# Patient Record
Sex: Male | Born: 1970 | Race: White | Hispanic: No | Marital: Married | Smoking: Never smoker
Health system: Southern US, Community
[De-identification: ages and names within clinical notes are randomized; demographics above are authoritative.]

## PROBLEM LIST (undated history)

## (undated) HISTORY — PX: TONSILLECTOMY AND ADENOIDECTOMY: SHX28

---

## 2007-05-29 ENCOUNTER — Emergency Department (HOSPITAL_COMMUNITY): Admission: EM | Admit: 2007-05-29 | Discharge: 2007-05-29 | Payer: Self-pay | Admitting: Family Medicine

## 2021-10-23 NOTE — Progress Notes (Signed)
? ? ?Subjective:   ? ?CC: L thumb pain ? ?I, Christoper Fabian, LAT, ATC, am serving as scribe for Dr. Clementeen Graham. ? ?HPI: Shawn Parker is a 51 y/o male c/o L thumb pain x 1 month. MOI: Shawn Parker was skiing w/ polls in his hand, forcing his L thumb into extension/ABD. He locates his pain to L 1st MCP joint, all over the joint. ? ?L thumb swelling: yes ?Pinch strength: decreases ?Aggravating factors: trying to grip things, writing ?Treatments tried: ice, Tylenol ? ?Shawn Parker also c/o pain in his R 4th finger ongoing for about 3 weeks. He caught his finger in a metal loop while rock climbing. Shawn Parker locates pain to the DIP of the R 4th finger. ? ?Pertinent review of Systems: no fever or chills ? ?Relevant historical information: Healthy activ. Does rock climbing.  ? ? ?Objective:   ? ?Vitals:  ? 10/24/21 1133  ?BP: 118/80  ?Pulse: (!) 56  ?SpO2: 99%  ? ?General: Well Developed, well nourished, and in no acute distress.  ? ?MSK: Left thumb swelling at ulnar MCP.  ?Laxity to UCL stress significant compared to right.  ?Intact strength to thumb flexion and extension at MCP.  ?Pulses and cap refill intact.  ? ?Right 4th DIP swelling and TTP at ulnar side of joint.  ?Decreased ROM at DIP.  ?Intact strength to flexion and extension.  ? ? ?Lab and Radiology Results ? ?Diagnostic Limited MSK Ultrasound of: Left thumb ?Hypoechoic fluid collection ulnar side of the near MCP. ?Unfortunately I cannot tell for sure if the UCL is torn due to resolution limitations of the ultrasound unit. ?However there is more laxity than I would expect at the ulnar side of the joint with UCL stress test. ?Impression: Possible UCL tear although not definitive on this ultrasound ? ? ?Xray images of right 4th digit and left 1st digit obtained today personally and independently interpreted. ? ?Right fourth DIP: Avulsion fracture 4th DIP at ulnar side. Fracture extends into the joint.  ? ?Left 1st MCP: No acute fracture present.  ? ?Await formal radiology review ? ? ?Impression  and Recommendations:   ? ?Assessment and Plan: ?51 y.o. male with Left thumb injury: Thought to be UCL injury/gamekeeper thumb. He is active and require the active use of his hand with rock climbing. I am concerned that his injury may not do well with conservative management. Plan to refer to hand surgery to explore surgical options. In the interim plan for short thumb spica splint.  ? ?Right 4th DIP Unexpected fracture seen on xray as Shawn Parker left the clinic.  Fortunately he was treated with a STAX splint which is appropriate for this injury.  Plan to recheck in 2 weeks.  ? ?PDMP not reviewed this encounter. ?Orders Placed This Encounter  ?Procedures  ? DG Finger Thumb Left  ?  Standing Status:   Future  ?  Number of Occurrences:   1  ?  Standing Expiration Date:   10/25/2022  ?  Order Specific Question:   Reason for Exam (SYMPTOM  OR DIAGNOSIS REQUIRED)  ?  Answer:   left thumb pain  ?  Order Specific Question:   Preferred imaging location?  ?  Answer:   [facility]  ? [place] LIMITED JOINT SPACE STRUCTURES UP LEFT(NO LINKED CHARGES)  ?  Order Specific Question:   Reason for Exam (SYMPTOM  OR DIAGNOSIS REQUIRED)  ?  Answer:   left thumb pain  ?  Order Specific Question:   Preferred imaging  location?  ?  Answer:   [facility]  ? DG Finger Ring Right  ?  Standing Status:   Future  ?  Number of Occurrences:   1  ?  Standing Expiration Date:   10/25/2022  ?  Order Specific Question:   Reason for Exam (SYMPTOM  OR DIAGNOSIS REQUIRED)  ?  Answer:   right ring finger pain  ?  Order Specific Question:   Preferred imaging location?  ?  Answer:   [facility]  ? Ambulatory referral to Orthopedic Surgery  ?  Referral Priority:   Routine  ?  Referral Type:   Surgical  ?  Referral Reason:   Specialty Services Required  ?  Referred to Provider:   Marlyne Beards, MD  ?  Requested Specialty:   Orthopedic Surgery  ?  Number of Visits Requested:   1  ? ?No orders of the defined types were  placed in this encounter. ? ? ?Discussed warning signs or symptoms. Please see discharge instructions. Patient expresses understanding. ? ? ?The above documentation has been reviewed and is accurate and complete Clementeen Graham, M.D. ? ?

## 2021-10-24 ENCOUNTER — Ambulatory Visit (INDEPENDENT_AMBULATORY_CARE_PROVIDER_SITE_OTHER): Payer: BLUE CROSS/BLUE SHIELD

## 2021-10-24 ENCOUNTER — Ambulatory Visit (INDEPENDENT_AMBULATORY_CARE_PROVIDER_SITE_OTHER): Payer: BLUE CROSS/BLUE SHIELD | Admitting: Family Medicine

## 2021-10-24 ENCOUNTER — Ambulatory Visit: Payer: Self-pay

## 2021-10-24 VITALS — BP 118/80 | HR 56 | Ht 70.0 in | Wt 166.0 lb

## 2021-10-24 DIAGNOSIS — S6991XA Unspecified injury of right wrist, hand and finger(s), initial encounter: Secondary | ICD-10-CM | POA: Diagnosis not present

## 2021-10-24 DIAGNOSIS — G8929 Other chronic pain: Secondary | ICD-10-CM | POA: Diagnosis not present

## 2021-10-24 DIAGNOSIS — M79645 Pain in left finger(s): Secondary | ICD-10-CM

## 2021-10-24 DIAGNOSIS — S63642A Sprain of metacarpophalangeal joint of left thumb, initial encounter: Secondary | ICD-10-CM

## 2021-10-24 DIAGNOSIS — S62664A Nondisplaced fracture of distal phalanx of right ring finger, initial encounter for closed fracture: Secondary | ICD-10-CM

## 2021-10-24 IMAGING — DX DG FINGER THUMB 2+V*L*
3 series · 3 of 3 positions shown · non-contrast
Comparison: None.

CLINICAL DATA: Left thumb pain.  Skiing injury 1 month ago.

EXAM:
LEFT THUMB 2+V

[finger ap]
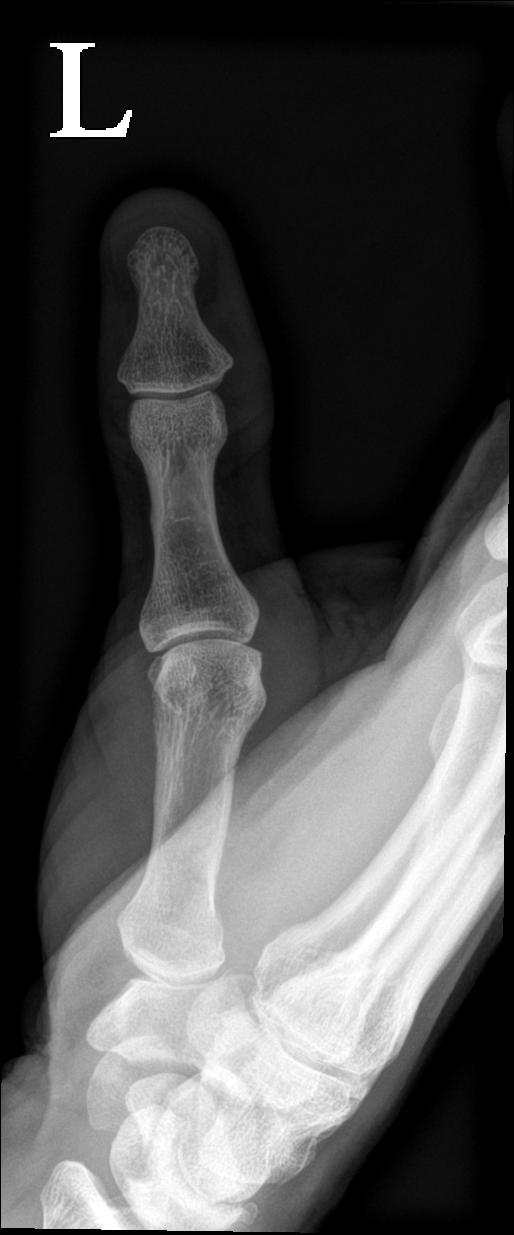

[finger obl]
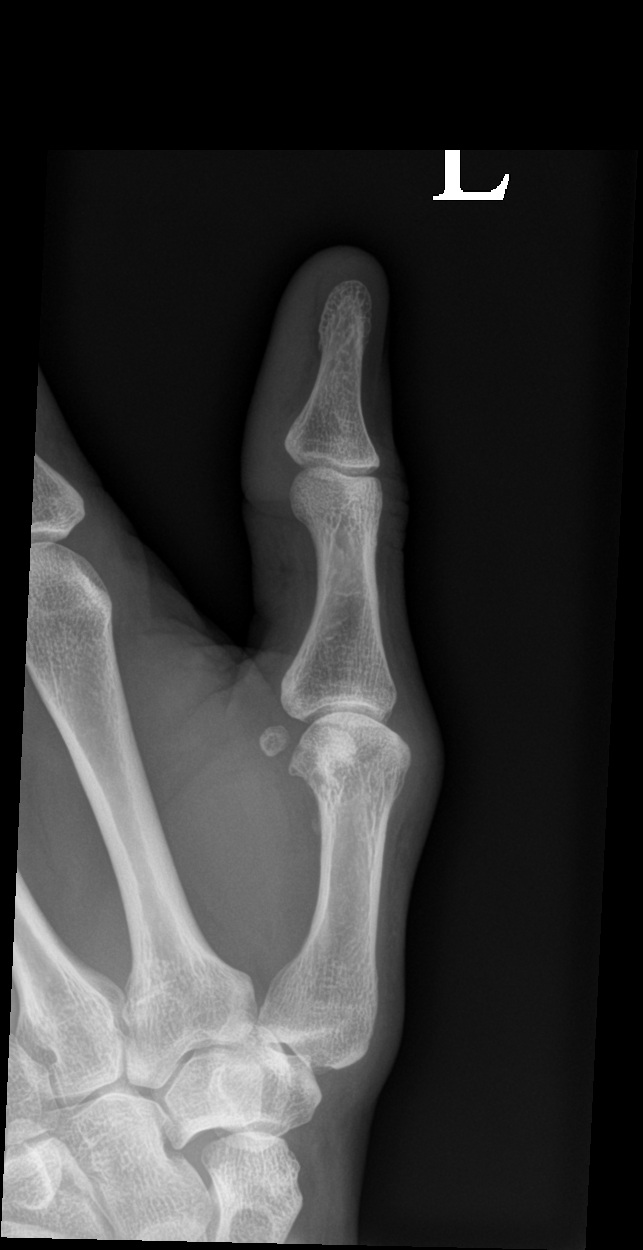

[finger lat]
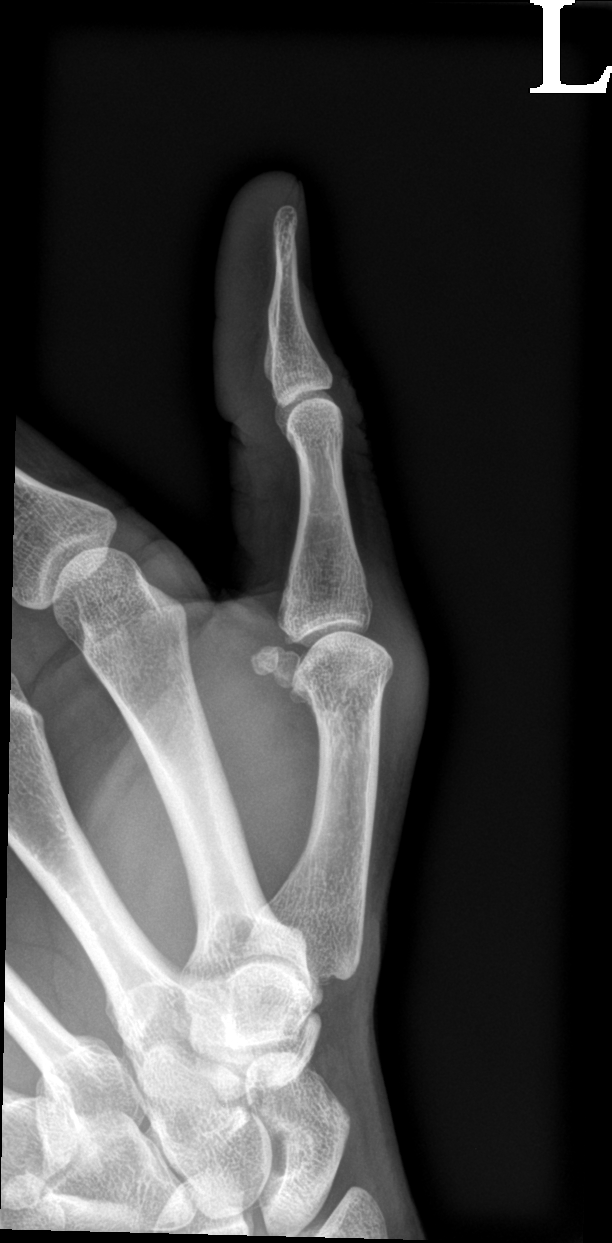

[3 of 3 positions shown; findings below may reference images not displayed]

FINDINGS: Minimal lateral aspect of the thumb metacarpophalangeal joint space
narrowing. Minimal thumb carpometacarpal joint space narrowing.
Minimal volar thumb metacarpal head degenerative osteophytosis. No
acute fracture is seen. No dislocation.
IMPRESSION: Mild osteoarthritis.  No acute fracture.

## 2021-10-24 IMAGING — DX DG FINGER RING 2+V*R*
3 series · 3 of 3 positions shown · non-contrast
Comparison: None

CLINICAL DATA: Right ring finger pain after injury rock climbing
2-3 weeks prior.

EXAM:
RIGHT RING FINGER 2+V

[finger ap]
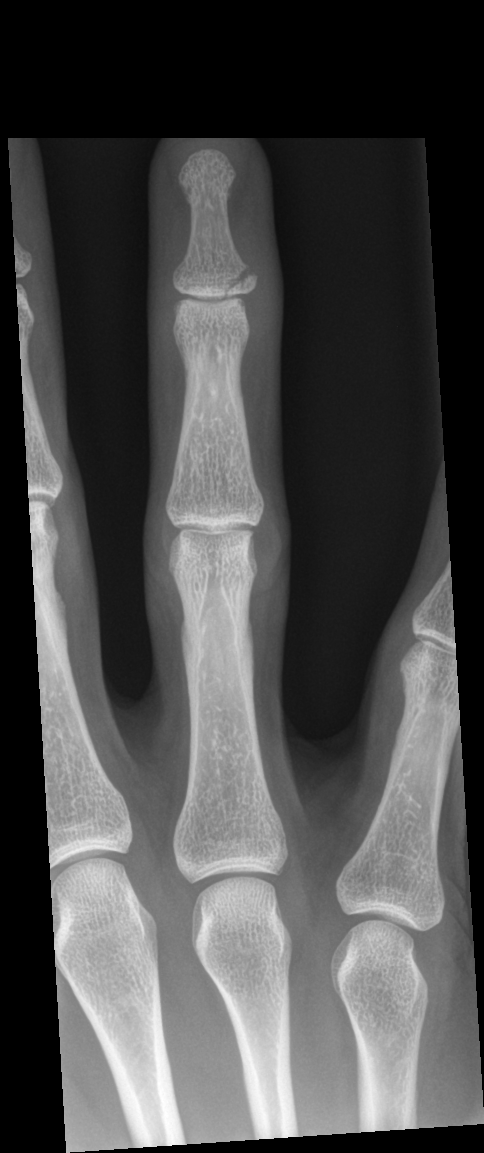

[finger obl]
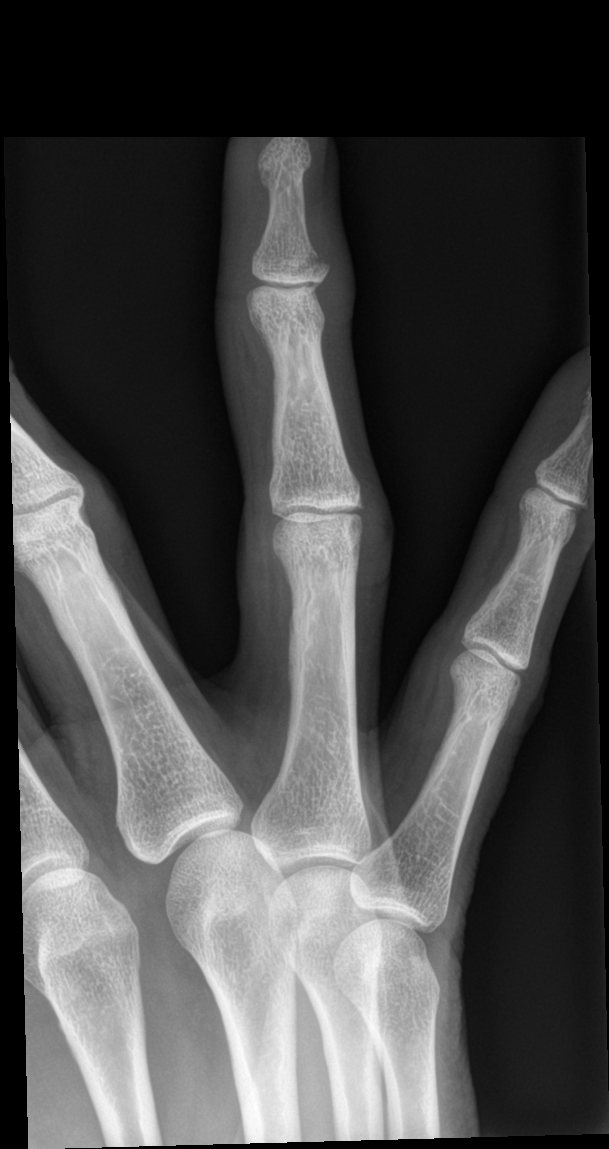

[finger lat]
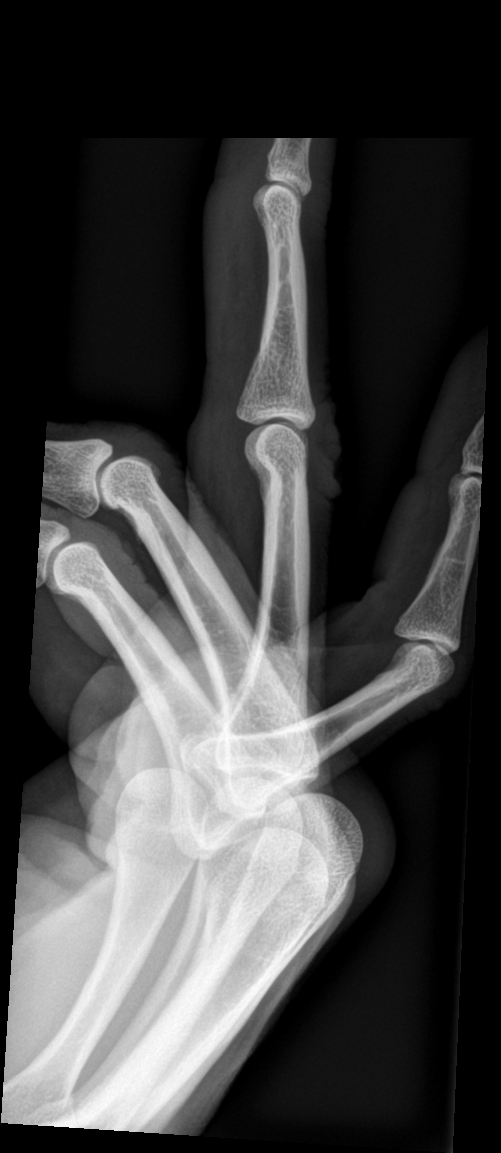

[3 of 3 positions shown; findings below may reference images not displayed]

FINDINGS: Oblique linear lucency within the medial base of the distal phalanx
of the ring finger with extension of the proximal articular surface,
with only 1 mm diastasis at the distal medial cortex but otherwise
no displacement. Joint spaces are preserved. No dislocation.
IMPRESSION: Acute, nondisplaced, intra-articular fracture of the medial base of
the distal phalanx of fourth finger.

## 2021-10-24 NOTE — Patient Instructions (Addendum)
Thank you for coming in today.  ? ?I've referred you to Orthopedic Surgery.  Let us know if you don't hear from them in one week.  ? ?Please go to [facility] supply to get the short thumb spica splint we talked about today. You may also be able to get it from [employer].   ? ?Recheck back as needed ? ?

## 2021-10-26 NOTE — Progress Notes (Signed)
Right ring finger x-ray does show a small fracture at the very end of the finger like we talked about.

## 2021-10-26 NOTE — Progress Notes (Signed)
Thumb x-ray shows a little bit of arthritis at the base of the thumb but otherwise looks okay to radiology.

## 2021-10-30 ENCOUNTER — Ambulatory Visit (INDEPENDENT_AMBULATORY_CARE_PROVIDER_SITE_OTHER): Payer: BLUE CROSS/BLUE SHIELD | Admitting: Orthopedic Surgery

## 2021-10-30 ENCOUNTER — Encounter: Payer: Self-pay | Admitting: Orthopedic Surgery

## 2021-10-30 VITALS — Ht 70.0 in | Wt 169.2 lb

## 2021-10-30 DIAGNOSIS — S63642A Sprain of metacarpophalangeal joint of left thumb, initial encounter: Secondary | ICD-10-CM

## 2021-10-30 DIAGNOSIS — S62634A Displaced fracture of distal phalanx of right ring finger, initial encounter for closed fracture: Secondary | ICD-10-CM | POA: Diagnosis not present

## 2021-10-30 NOTE — Progress Notes (Signed)
? ?Office Visit Note ?  ?Patient: Shawn Parker           ?Date of Birth: Sep 04, 1970           ?MRN: [id number] ?Visit Date: 10/30/2021 ?             ?Requested by: Gregor Hams, MD ?[place],  [place] [id number] ?PCP: No primary care provider on file. ? ? ?Assessment & Plan: ?Visit Diagnoses:  ?1. Rupture of ulnar collateral ligament of left thumb, initial encounter   ?2. Closed displaced fracture of distal phalanx of right ring finger, initial encounter   ? ? ?Plan: We reviewed the nature of thumb collateral ligament injuries as well as their diagnosis, prognosis, and both conservative and surgical treatment options.  He does have slightly increased laxity compared to the contralateral side at 30 degrees of flexion but has a firm endpoint.  He has pain with palpation of the metacarpal but no pain with ulnar stress.  I would like to get an MRI to further evaluate his ulnar collateral ligament.  I will see him back in the office once the MRI is completed. ? ?He also has a fracture of the right ring finger distal phalanx at the ulnar base consistent with a collateral ligament avulsion type fracture.  He is in a extension splint currently.  He has no instability at this joint with minimal tenderness to palpation.  Discussed the nature of fracture healing.  We will keep him in the splint for several more weeks. ? ?Follow-Up Instructions: No follow-ups on file.  ? ?Orders:  ?Orders Placed This Encounter  ?Procedures  ? MR HAND LEFT WO CONTRAST  ? ?No orders of the defined types were placed in this encounter. ? ? ? ? Procedures: ?No procedures performed ? ? ?Clinical Data: ?No additional findings. ? ? ?Subjective: ?Chief Complaint  ?Patient presents with  ? Left Hand - New Patient (Initial Visit)  ? Right Hand - New Patient (Initial Visit)  ? ? ?This is a 51 year old left-hand-dominant male who presents with 2 injuries to his left and right hands.  He was skiing about a month ago when he fell and got his hand  caught in some deep snow.  The pole forced his left thumb into extension and abduction.  Since then he has developed swelling and pain across the thumb MP joint.  His pain seems to be mostly at the ulnar aspect of the joint.  He has been wearing a thumb spica brace on the left hand since he was seen in the sports medicine office on 4/26.  He notes that the joint has stiffened somewhat since that visit with mildly increased pain and swelling.  He denies any previous injuries to this thumb.  On the right side, he was found to have a fracture of the ulnar base of the right ring finger distal phalanx.  He got this finger caught in a climbing ring approximately 3 weeks ago.  He has some pain in this joint with tight gripping with this finger as you would see with gripping a climbing hold.  He has been in a stack splint since the visit with the sports medicine office. ? ? ?Review of Systems ? ? ?Objective: ?Vital Signs: Ht 5\' 10"  (1.778 m)   Wt 169 lb 3.2 oz (76.7 kg)   BMI 24.28 kg/m?  ? ?Physical Exam ?Constitutional:   ?   Appearance: Normal appearance.  ?Cardiovascular:  ?   Rate and  Rhythm: Normal rate.  ?   Pulses: Normal pulses.  ?Pulmonary:  ?   Effort: Pulmonary effort is normal.  ?Skin: ?   General: Skin is warm and dry.  ?   Capillary Refill: Capillary refill takes less than 2 seconds.  ?Neurological:  ?   Mental Status: He is alert.  ? ? ?Right Hand Exam  ? ?Tenderness  ?Right hand tenderness location: Mildly TTP at ulnar aspect of ring finger distal phalangeal base. ? ?Other  ?Erythema: absent ?Sensation: normal ?Pulse: present ? ?Comments:  No laxity or instability at ring finger DIP joint. Limited flexion secondary to mild stiffness.  ? ? ?Left Hand Exam  ? ?Tenderness  ?Left hand tenderness location: TTP at distal aspect of thumb metacarpal with moderate ulnar sided swelling.  ? ?Other  ?Erythema: absent ?Sensation: normal ?Pulse: present ? ?Comments:  Moderate ulnar thumb MCP swelling.  Slightly  increased laxity at MP joint w/ radial deviation compared to contralateral side in 30 deg flexion but with firm end point.  Min TTP at radial side.  No laxity with ulnar deviation.  ? ? ? ? ?Specialty Comments:  ?No specialty comments available. ? ?Imaging: ?No results found. ? ? ?PMFS History: ?Patient Active Problem List  ? Diagnosis Date Noted  ? Closed displaced fracture of distal phalanx of right ring finger 10/30/2021  ? ?History reviewed. No pertinent past medical history.  ?History reviewed. No pertinent family history.  ?History reviewed. No pertinent surgical history. ?Social History  ? ?Occupational History  ? Not on file  ?Tobacco Use  ? Smoking status: Not on file  ? Smokeless tobacco: Not on file  ?Substance and Sexual Activity  ? Alcohol use: Not on file  ? Drug use: Not on file  ? Sexual activity: Not on file  ? ? ? ? ? ? ?

## 2021-11-10 ENCOUNTER — Ambulatory Visit
Admission: RE | Admit: 2021-11-10 | Discharge: 2021-11-10 | Disposition: A | Discharge status: Home / Self Care | Payer: BLUE CROSS/BLUE SHIELD | Source: Ambulatory Visit | Attending: Orthopedic Surgery | Admitting: Orthopedic Surgery

## 2021-11-10 DIAGNOSIS — S63642A Sprain of metacarpophalangeal joint of left thumb, initial encounter: Secondary | ICD-10-CM

## 2021-11-10 IMAGING — MR MR [PERSON_NAME]*[PERSON_NAME]* W/O CM
9 of 10 series · 29 of 40 positions shown · non-contrast
Comparison: X-ray [DATE]

CLINICAL DATA: Left thumb injury

EXAM:
MRI OF THE LEFT HAND WITHOUT CONTRAST
TECHNIQUE: Multiplanar, multisequence MR imaging of the left thumb was
performed. No intravenous contrast was administered.

[Series 11: T1 · axial · 4.0mm · 0.39mm/px · z∈[-53,+72]mm · 3 of 30 slices shown]
[im 1/30]
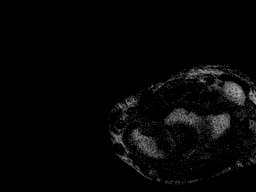
[im 15/30]
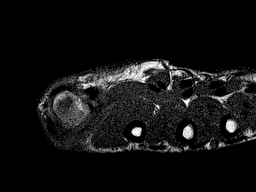
[im 30/30]
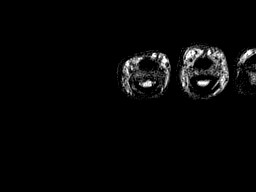

[Series 12: T2 fat-sat · axial · 4.0mm · 0.39mm/px · z∈[-50,+75]mm · 4 of 30 slices shown (1 of 4)]
[im 1/30]
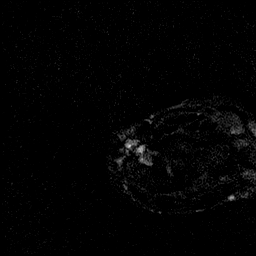
[im 10/30]
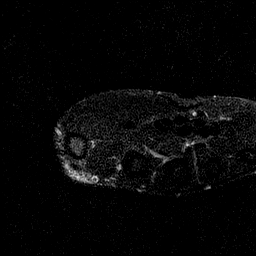
[im 20/30]
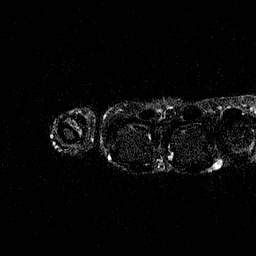
[im 30/30]
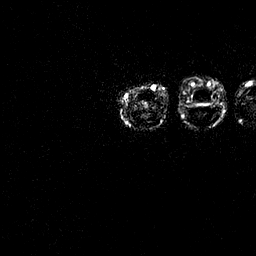

[Series 13: PD fat-sat · oblique · 4.0mm · 0.39mm/px · 1 of 9 slices shown (1 of 2)]
[im 1/9]
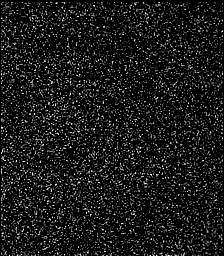

[Series 14: T2 fat-sat · oblique · 2.0mm · 0.39mm/px · 2 of 17 slices shown (2 of 4)]
[im 1/17]
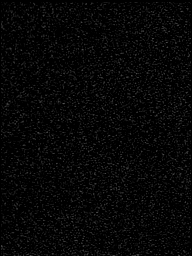
[im 17/17]
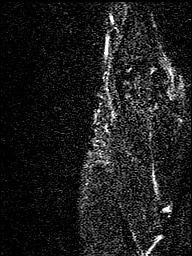

[Series 15: PD fat-sat · oblique · 2.0mm · 0.39mm/px · 2 of 16 slices shown (2 of 2)]
[im 1/16]
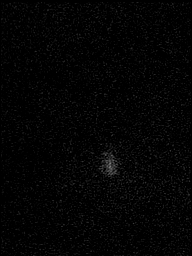
[im 16/16]
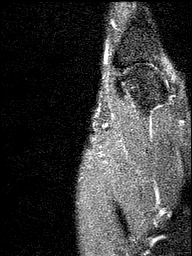

[Series 16: T2 fat-sat · oblique · 2.0mm · 0.39mm/px · 2 of 17 slices shown (3 of 4)]
[im 1/17]
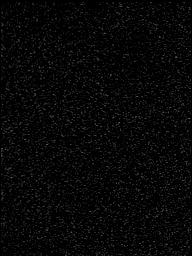
[im 17/17]
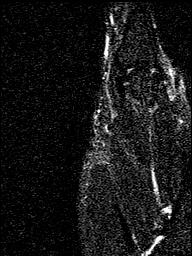

[Series 17: [id number]_cor_iso_320 · oblique · 0.4mm · 0.19mm/px · 8 of 104 slices shown]
[im 1/104]
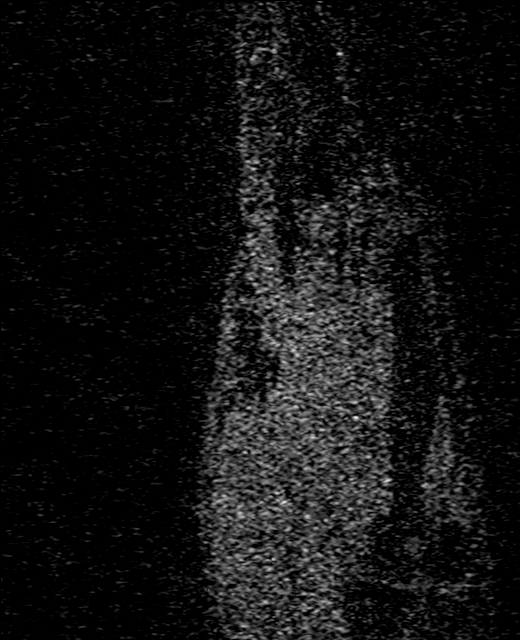
[im 19/104]
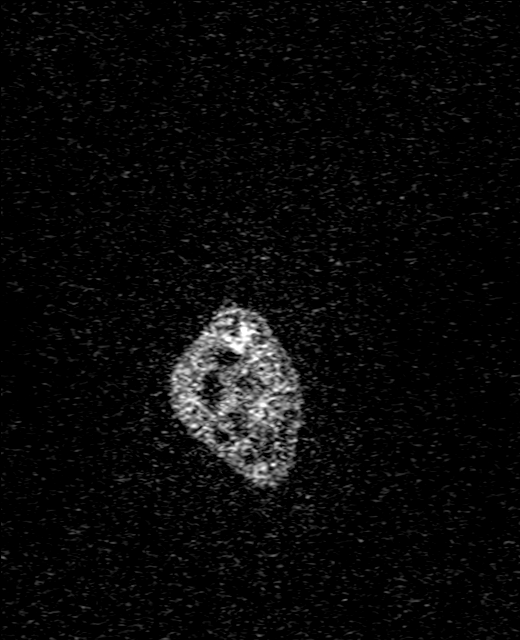
[im 29/104]
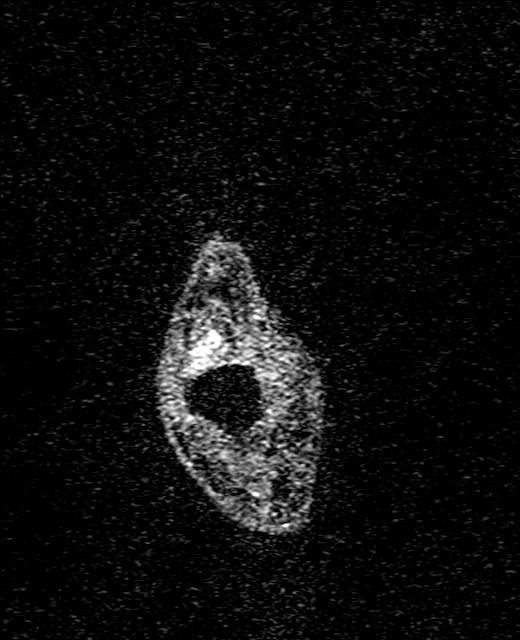
[im 47/104]
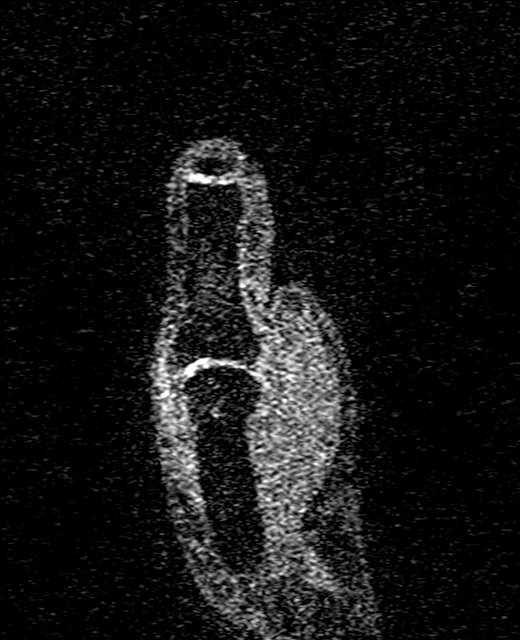
[im 57/104]
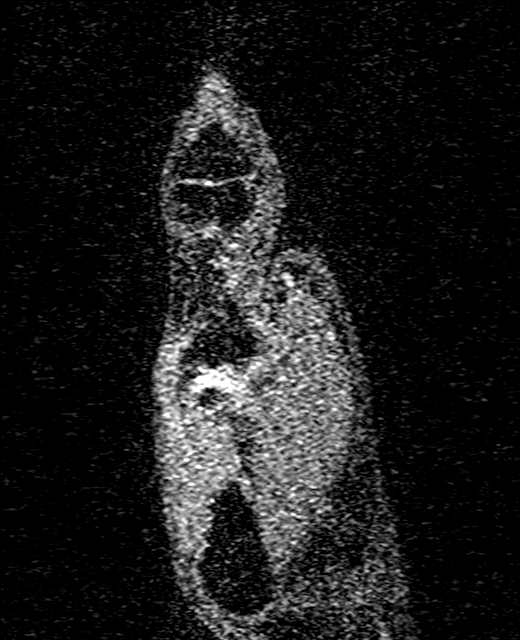
[im 75/104]
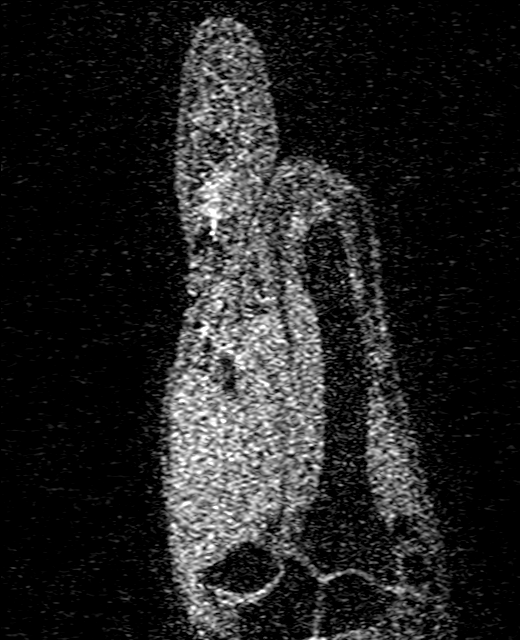
[im 85/104]
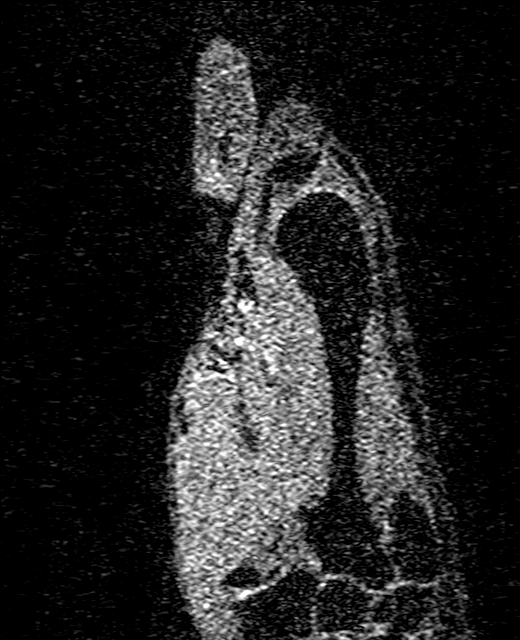
[im 104/104]
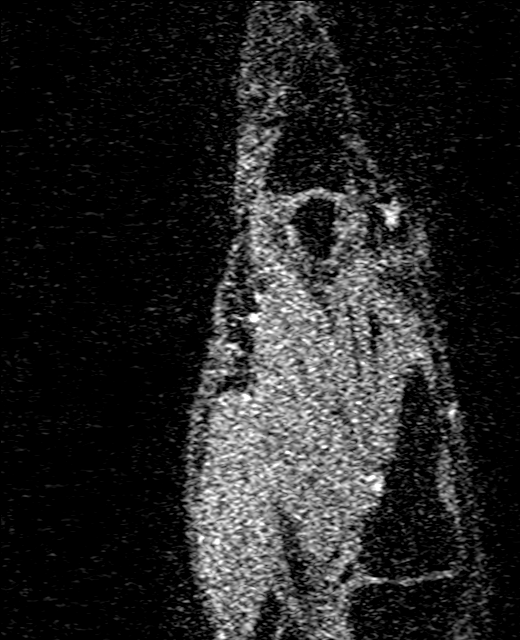

[Series 18: T2 fat-sat · axial · 4.0mm · 0.39mm/px · z∈[-50,+75]mm · 4 of 30 slices shown (4 of 4)]
[im 1/30]
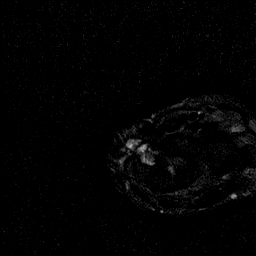
[im 10/30]
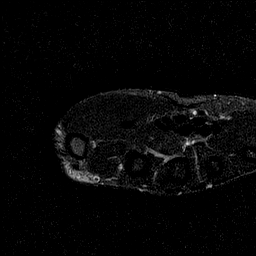
[im 20/30]
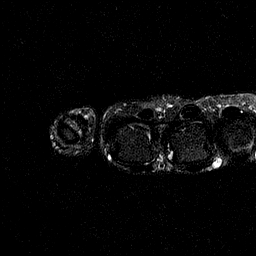
[im 30/30]
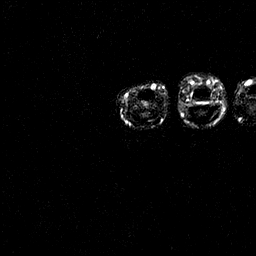

[Series 19: sag mpr · sagittal · 1.0mm · 0.19mm/px · 3 of 35 slices shown]
[im 1/35]
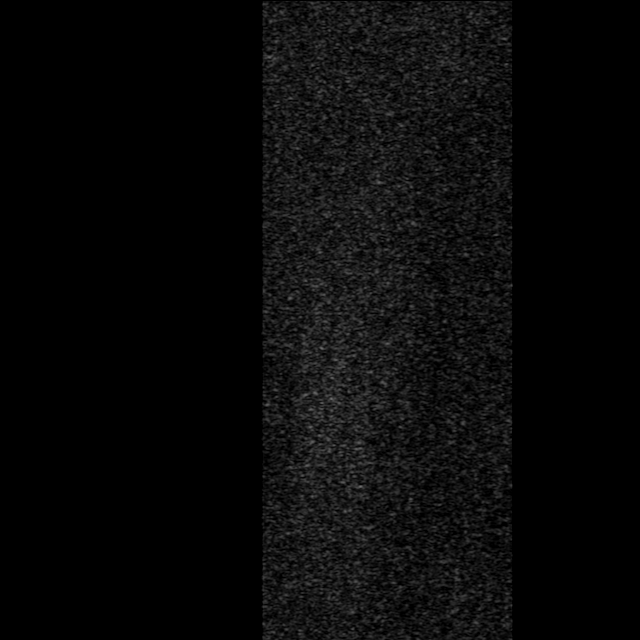
[im 12/35]
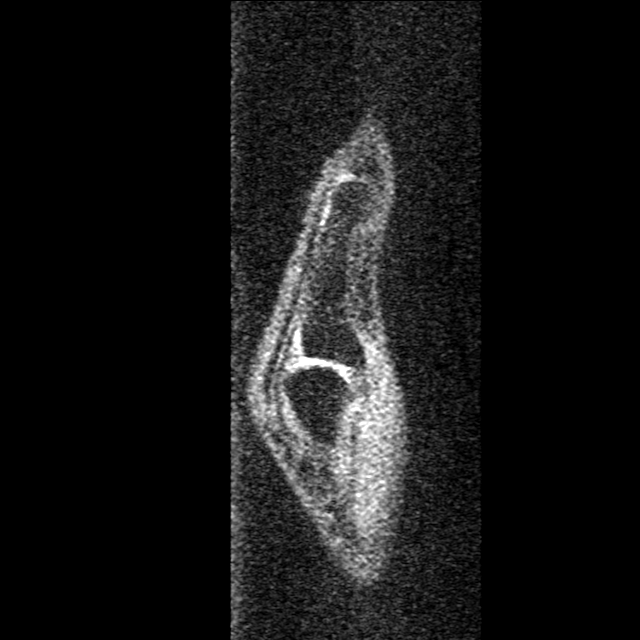
[im 23/35]
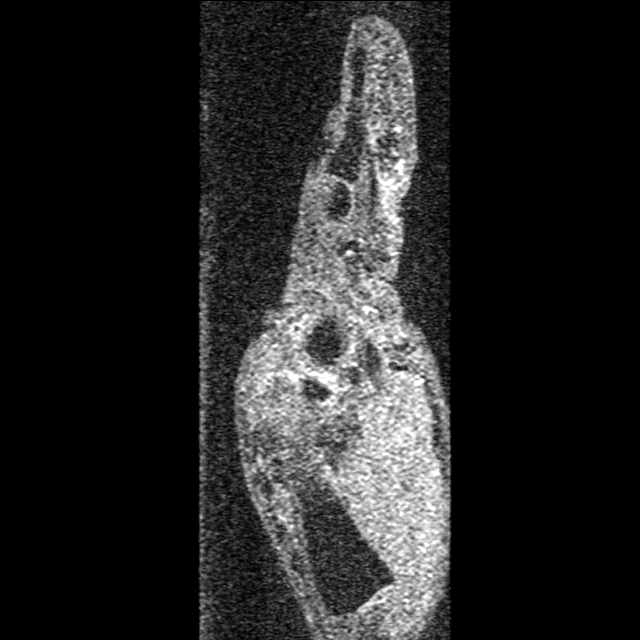

[29 of 40 positions shown; findings below may reference images not displayed]

FINDINGS: Bones/Joint/Cartilage

No evidence of acute fracture or dislocation. There is slight radial
subluxation of the left thumb proximal phalanx at the MCP joint.
Bone marrow edema within the first metacarpal head and base of the
thumb proximal phalanx. Diffuse chondral thinning. Moderate-sized
first MCP joint effusion. Mild osteoarthritis of the first CMC
joint.

Ligaments

Ulnar collateral ligament of the first MCP joint is completely torn
with proximal retraction of the torn ligament fibers (series 20,
image 6). Torn aspect of the ligament appears to extend
superficially to the adjacent adductor aponeurosis. Radial
collateral ligament of the first MCP joint appears heterogeneous,
but grossly intact.

Muscles and Tendons

Intact flexor and extensor tendons without tear or tenosynovitis.
Mild intramuscular edema within the adjacent adductor pollicis
muscle.

Soft tissues

Mild soft tissue edema.  No fluid collection or hematoma.
IMPRESSION: 1. Complete tear of the ulnar collateral ligament of the first MCP
joint with proximal retraction of the torn ligament fibers. Torn
aspect of the ligament appears to extend superficially to the
adjacent adductor aponeurosis, suggesting Stener lesion.
2. Bone marrow edema within the first metacarpal head and base of
the thumb proximal phalanx, likely reactive. Moderate-sized first
MCP joint effusion.
3. Mild osteoarthritis of the first MCP and CMC joints.

## 2021-11-20 ENCOUNTER — Other Ambulatory Visit: Payer: Self-pay | Admitting: Orthopedic Surgery

## 2021-11-20 ENCOUNTER — Telehealth: Payer: Self-pay | Admitting: Orthopedic Surgery

## 2021-11-20 ENCOUNTER — Ambulatory Visit: Payer: BLUE CROSS/BLUE SHIELD | Admitting: Orthopedic Surgery

## 2021-11-20 DIAGNOSIS — S63642A Sprain of metacarpophalangeal joint of left thumb, initial encounter: Secondary | ICD-10-CM

## 2021-11-20 NOTE — Telephone Encounter (Signed)
Scheduled for 11/23/21 @ 945am.

## 2021-11-20 NOTE — Telephone Encounter (Signed)
Pt called stated he got a message about MRI. Please call pt at [phone].

## 2021-11-21 ENCOUNTER — Ambulatory Visit
Admission: RE | Admit: 2021-11-21 | Discharge: 2021-11-21 | Disposition: A | Discharge status: Home / Self Care | Payer: BLUE CROSS/BLUE SHIELD | Source: Ambulatory Visit | Attending: Orthopedic Surgery | Admitting: Orthopedic Surgery

## 2021-11-21 DIAGNOSIS — S63642A Sprain of metacarpophalangeal joint of left thumb, initial encounter: Secondary | ICD-10-CM

## 2021-11-21 IMAGING — MR MR [PERSON_NAME]*[PERSON_NAME]* W/O CM
5 series · 40 of 40 positions shown · non-contrast
Comparison: X-ray [DATE]

CLINICAL DATA: Left thumb injury

EXAM:
MRI OF THE LEFT HAND WITHOUT CONTRAST
TECHNIQUE: Multiplanar, multisequence MR imaging of the left thumb was
performed. No intravenous contrast was administered.

[Series 13: t1_ax obl · axial · 3.0mm · 0.39mm/px · z∈[-86,+84]mm · 14 of 48 slices shown]
[im 1/48]
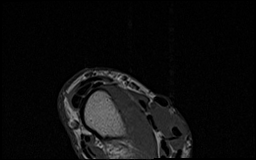
[im 4/48]
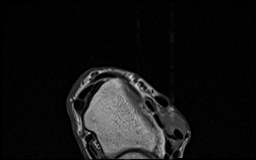
[im 8/48]
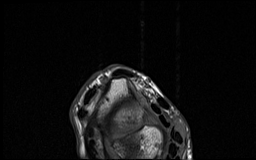
[im 11/48]
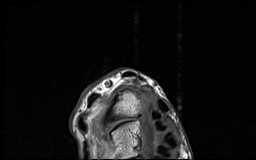
[im 15/48]
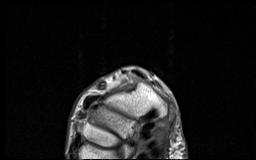
[im 19/48]
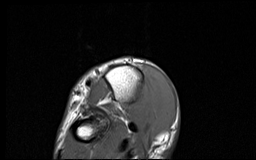
[im 22/48]
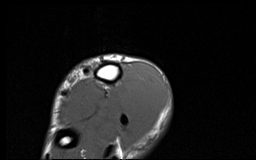
[im 26/48]
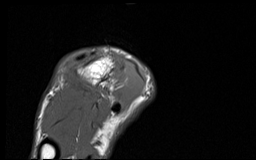
[im 29/48]
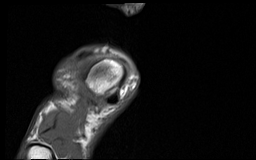
[im 33/48]
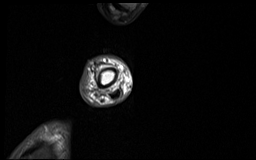
[im 37/48]
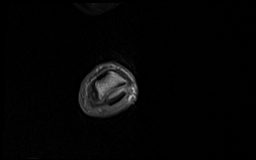
[im 40/48]
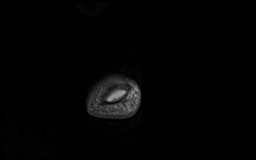
[im 44/48]
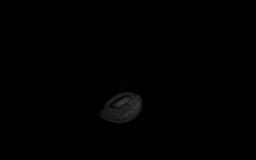
[im 48/48]
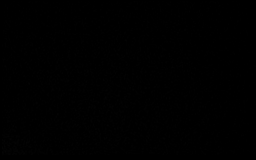

[Series 15: PD fat-sat · sagittal · 2.0mm · 0.36mm/px · 7 of 23 slices shown (1 of 2)]
[im 1/23]
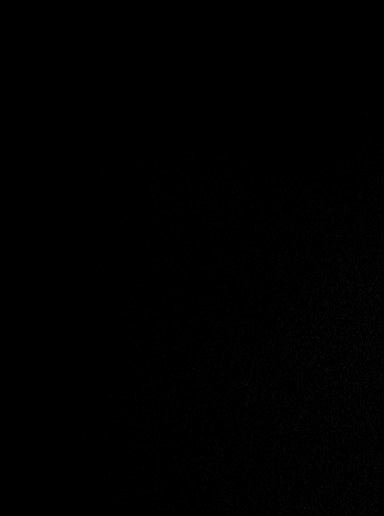
[im 4/23]
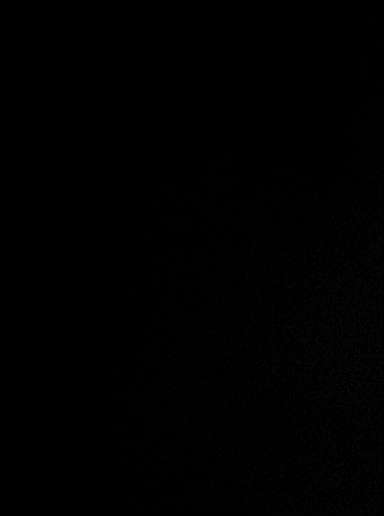
[im 8/23]
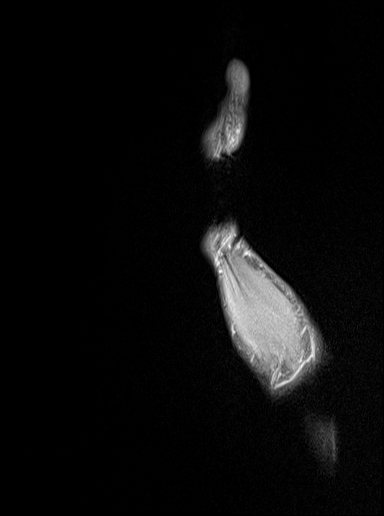
[im 12/23]
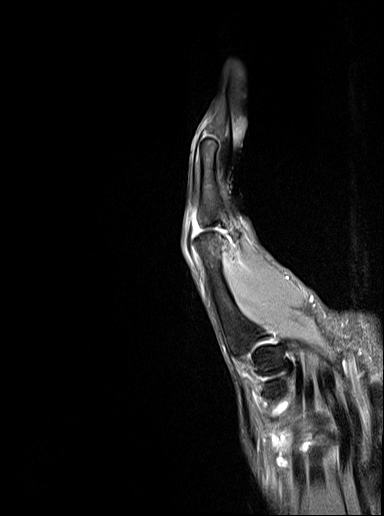
[im 15/23]
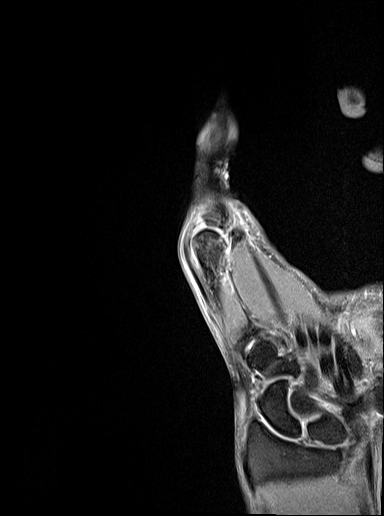
[im 19/23]
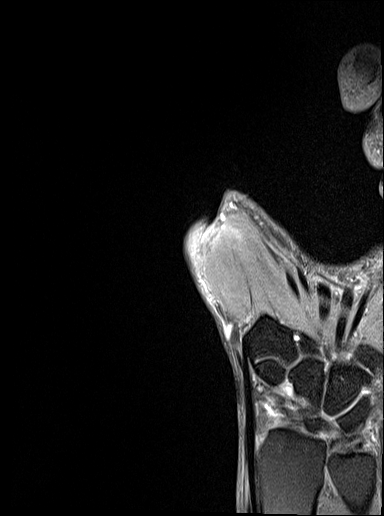
[im 23/23]
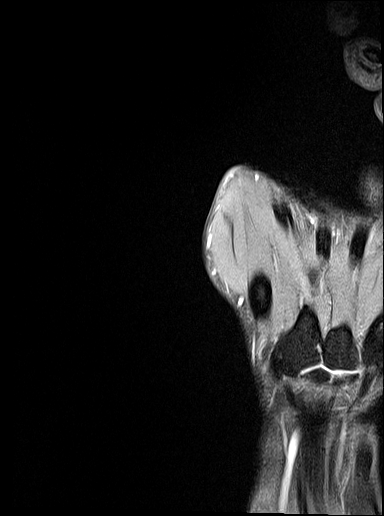

[Series 20: PD fat-sat · coronal · 2.0mm · 0.31mm/px · 4 of 15 slices shown (2 of 2)]
[im 1/15]
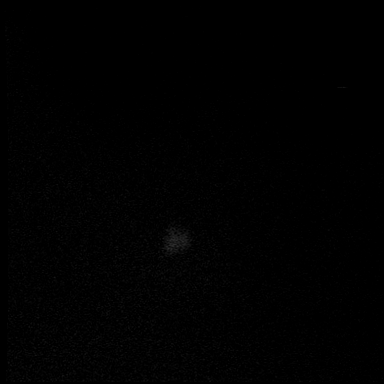
[im 5/15]
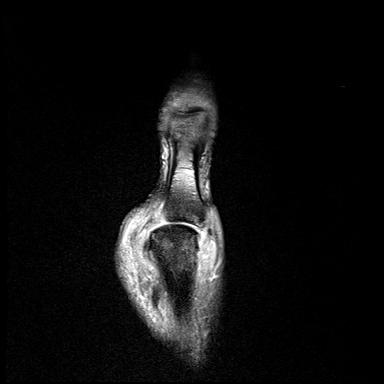
[im 10/15]
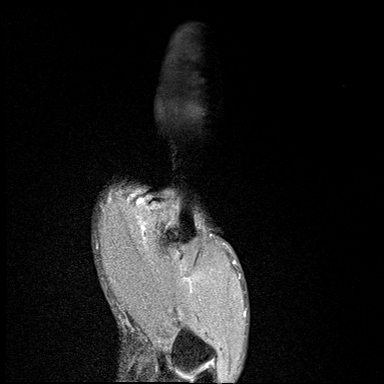
[im 15/15]
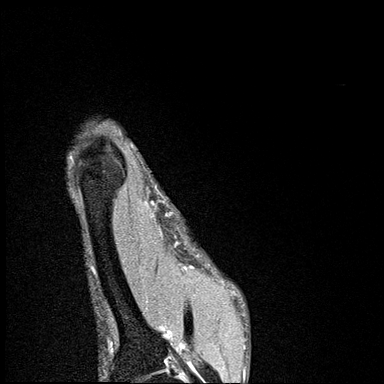

[Series 21: T2 fat-sat · coronal · 2.0mm · 0.38mm/px · 4 of 15 slices shown (1 of 2)]
[im 1/15]
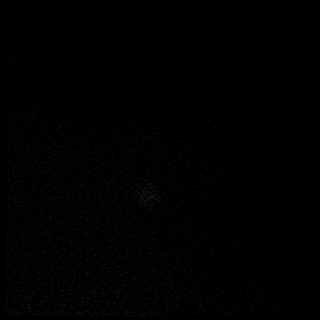
[im 5/15]
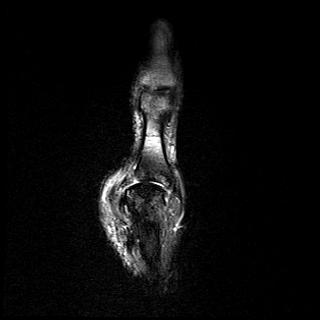
[im 10/15]
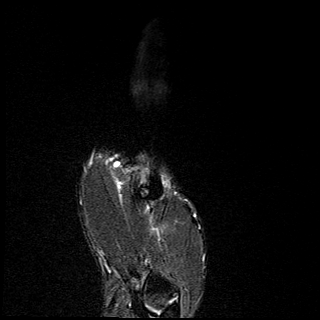
[im 15/15]
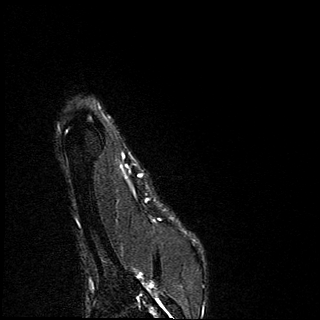

[Series 22: T2 fat-sat · axial · 3.0mm · 0.31mm/px · z∈[-61,+67]mm · 11 of 36 slices shown (2 of 2)]
[im 1/36]
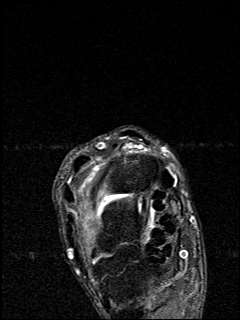
[im 4/36]
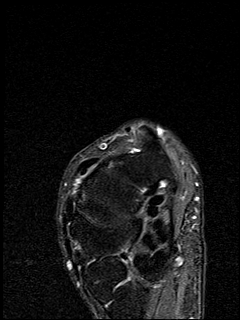
[im 8/36]
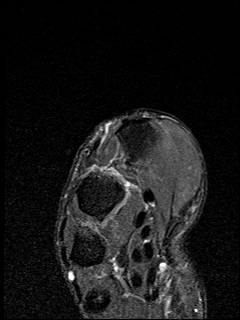
[im 11/36]
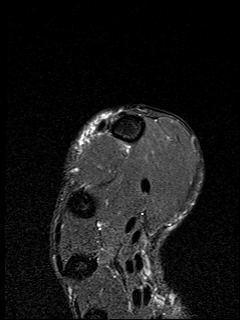
[im 15/36]
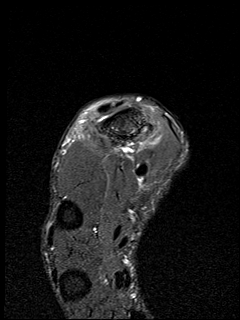
[im 18/36]
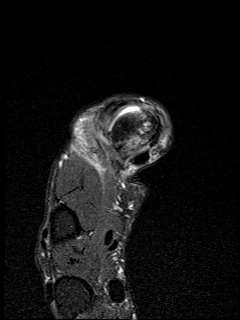
[im 22/36]
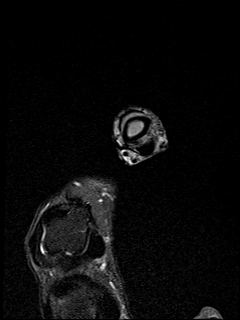
[im 25/36]
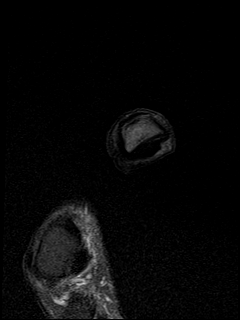
[im 29/36]
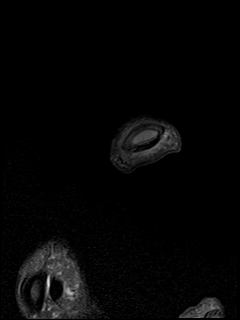
[im 32/36]
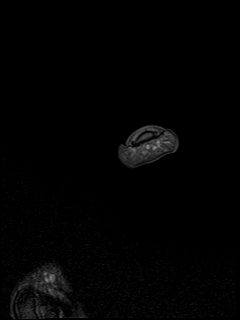
[im 36/36]
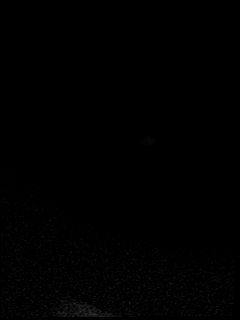

[40 of 40 positions shown; findings below may reference images not displayed]

FINDINGS: Bones/Joint/Cartilage

No evidence of acute fracture or dislocation. There is slight radial
subluxation of the left thumb proximal phalanx at the MCP joint.
Bone marrow edema within the first metacarpal head and base of the
thumb proximal phalanx. Diffuse chondral thinning. Moderate-sized
first MCP joint effusion. Mild osteoarthritis of the first CMC
joint.

Ligaments

Ulnar collateral ligament of the first MCP joint is completely torn
with proximal retraction of the torn ligament fibers (series 20,
image 6). Torn aspect of the ligament appears to extend
superficially to the adjacent adductor aponeurosis. Radial
collateral ligament of the first MCP joint appears heterogeneous,
but grossly intact.

Muscles and Tendons

Intact flexor and extensor tendons without tear or tenosynovitis.
Mild intramuscular edema within the adjacent adductor pollicis
muscle.

Soft tissues

Mild soft tissue edema.  No fluid collection or hematoma.
IMPRESSION: 1. Complete tear of the ulnar collateral ligament of the first MCP
joint with proximal retraction of the torn ligament fibers. Torn
aspect of the ligament appears to extend superficially to the
adjacent adductor aponeurosis, suggesting Stener lesion.
2. Bone marrow edema within the first metacarpal head and base of
the thumb proximal phalanx, likely reactive. Moderate-sized first
MCP joint effusion.
3. Mild osteoarthritis of the first MCP and CMC joints.

## 2021-11-23 ENCOUNTER — Encounter: Payer: Self-pay | Admitting: Orthopedic Surgery

## 2021-11-23 ENCOUNTER — Ambulatory Visit (INDEPENDENT_AMBULATORY_CARE_PROVIDER_SITE_OTHER): Payer: BLUE CROSS/BLUE SHIELD | Admitting: Orthopedic Surgery

## 2021-11-23 DIAGNOSIS — S63642A Sprain of metacarpophalangeal joint of left thumb, initial encounter: Secondary | ICD-10-CM | POA: Diagnosis not present

## 2021-11-23 NOTE — Progress Notes (Signed)
Office Visit Note   Patient: Shawn Parker           Date of Birth: 12-May-1971           MRN: [id number] Visit Date: 11/23/2021              Requested by: No referring provider defined for this encounter. PCP: Pcp, No   Assessment & Plan: Visit Diagnoses:  1. Rupture of ulnar collateral ligament of left thumb, initial encounter     Plan: We reviewed his recent MRI with suggests a complete rupture of the thumb UCL with possible Stener lesion.  We again discussed the nature of this injury as well as both conservative and surgical treatment options.  Given the complete rupture and possible Stener lesion, I have recommended surgical repair.  We discussed surgical repair of the ligament with a suture anchor versus ligament reconstruction using a tendon autograft.  We reviewed the risks of surgery including bleeding, infection, damage to neurovascular structures, stiffness, incomplete symptom relief, need for additional surgeries.  After our discussion, he would like to proceed with surgery.  A surgical date and time we confirm with the patient.  Follow-Up Instructions: No follow-ups on file.   Orders:  No orders of the defined types were placed in this encounter.  No orders of the defined types were placed in this encounter.     Procedures: No procedures performed   Clinical Data: No additional findings.   Subjective: Chief Complaint  Patient presents with   Left Wrist - Follow-up    This is a 51 year old left-hand-dominant male who presents for follow-up of an injury to his left thumb.  He was skiing about a month and a half ago when he fell and got his left hand caught in some deep snow which forced his left thumb into extension and abducted position.  He developed swelling and pain across the thumb MP joint.  He has been wearing a thumb spica splint and was seen in the sports medicine office approximately 1 month ago.  I last saw him a few weeks ago at which point we ordered  an MRI.  The MRI was not obtained until this week.  He notes continued pain at the ulnar aspect of the thumb that is worse with activity.  He has been wearing a thumb spica brace.  He is not taking any medication for pain as it is well controlled at the moment.   Review of Systems   Objective: Vital Signs: There were no vitals taken for this visit.  Physical Exam Constitutional:      Appearance: Normal appearance.  Cardiovascular:     Rate and Rhythm: Normal rate.     Pulses: Normal pulses.  Pulmonary:     Effort: Pulmonary effort is normal.  Skin:    General: Skin is warm and dry.     Capillary Refill: Capillary refill takes less than 2 seconds.  Neurological:     Mental Status: He is alert.    Left Hand Exam   Tenderness  Left hand tenderness location: TTP at ulnar aspect of thumb over UCL.   Other  Erythema: absent Sensation: normal Pulse: present  Comments:  Moderate focal swelling at ulnar aspect of thumb MCPJ.  Pain w/ UCL testing with increased laxity compared to the contralateral side but with seemingly firm endpoint.      Specialty Comments:  No specialty comments available.  Imaging: No results found.   PMFS History: Patient Active Problem List  Diagnosis Date Noted   Rupture of UCL of left thumb 11/23/2021   Closed displaced fracture of distal phalanx of right ring finger 10/30/2021   History reviewed. No pertinent past medical history.  History reviewed. No pertinent family history.  History reviewed. No pertinent surgical history. Social History   Occupational History   Not on file  Tobacco Use   Smoking status: Not on file   Smokeless tobacco: Not on file  Substance and Sexual Activity   Alcohol use: Not on file   Drug use: Not on file   Sexual activity: Not on file

## 2021-11-23 NOTE — H&P (View-Only) (Signed)
Office Visit Note   Patient: Shawn Parker           Date of Birth: 12-May-1971           MRN: [id number] Visit Date: 11/23/2021              Requested by: No referring provider defined for this encounter. PCP: Pcp, No   Assessment & Plan: Visit Diagnoses:  1. Rupture of ulnar collateral ligament of left thumb, initial encounter     Plan: We reviewed his recent MRI with suggests a complete rupture of the thumb UCL with possible Stener lesion.  We again discussed the nature of this injury as well as both conservative and surgical treatment options.  Given the complete rupture and possible Stener lesion, I have recommended surgical repair.  We discussed surgical repair of the ligament with a suture anchor versus ligament reconstruction using a tendon autograft.  We reviewed the risks of surgery including bleeding, infection, damage to neurovascular structures, stiffness, incomplete symptom relief, need for additional surgeries.  After our discussion, he would like to proceed with surgery.  A surgical date and time we confirm with the patient.  Follow-Up Instructions: No follow-ups on file.   Orders:  No orders of the defined types were placed in this encounter.  No orders of the defined types were placed in this encounter.     Procedures: No procedures performed   Clinical Data: No additional findings.   Subjective: Chief Complaint  Patient presents with   Left Wrist - Follow-up    This is a 51 year old left-hand-dominant male who presents for follow-up of an injury to his left thumb.  He was skiing about a month and a half ago when he fell and got his left hand caught in some deep snow which forced his left thumb into extension and abducted position.  He developed swelling and pain across the thumb MP joint.  He has been wearing a thumb spica splint and was seen in the sports medicine office approximately 1 month ago.  I last saw him a few weeks ago at which point we ordered  an MRI.  The MRI was not obtained until this week.  He notes continued pain at the ulnar aspect of the thumb that is worse with activity.  He has been wearing a thumb spica brace.  He is not taking any medication for pain as it is well controlled at the moment.   Review of Systems   Objective: Vital Signs: There were no vitals taken for this visit.  Physical Exam Constitutional:      Appearance: Normal appearance.  Cardiovascular:     Rate and Rhythm: Normal rate.     Pulses: Normal pulses.  Pulmonary:     Effort: Pulmonary effort is normal.  Skin:    General: Skin is warm and dry.     Capillary Refill: Capillary refill takes less than 2 seconds.  Neurological:     Mental Status: He is alert.    Left Hand Exam   Tenderness  Left hand tenderness location: TTP at ulnar aspect of thumb over UCL.   Other  Erythema: absent Sensation: normal Pulse: present  Comments:  Moderate focal swelling at ulnar aspect of thumb MCPJ.  Pain w/ UCL testing with increased laxity compared to the contralateral side but with seemingly firm endpoint.      Specialty Comments:  No specialty comments available.  Imaging: No results found.   PMFS History: Patient Active Problem List  Diagnosis Date Noted   Rupture of UCL of left thumb 11/23/2021   Closed displaced fracture of distal phalanx of right ring finger 10/30/2021   History reviewed. No pertinent past medical history.  History reviewed. No pertinent family history.  History reviewed. No pertinent surgical history. Social History   Occupational History   Not on file  Tobacco Use   Smoking status: Not on file   Smokeless tobacco: Not on file  Substance and Sexual Activity   Alcohol use: Not on file   Drug use: Not on file   Sexual activity: Not on file

## 2021-11-28 ENCOUNTER — Encounter (HOSPITAL_COMMUNITY): Payer: Self-pay | Admitting: Orthopedic Surgery

## 2021-11-28 ENCOUNTER — Other Ambulatory Visit: Payer: Self-pay

## 2021-11-28 NOTE — Progress Notes (Signed)
PCP - Pt unable to remember the name, but states he has a Data processing manager - Denies  EP- Denies  Endocrine- Denies  Pulm- Denies  Chest x-ray - Denies  EKG - Denies  Stress Test - Denies  ECHO - Denies  Cardiac Cath - Denies  AICD-na PM-na LOOP-na  Nerve Stimulator- Denies  Dialysis- Denies  Sleep Study - Denies CPAP - Denies  LABS- 11/30/21: CBC  ASA- Denies  ERAS- No  HA1C- Denies  Anesthesia- No  Pt denies having chest pain, sob, or fever during the pre-op phone call. All instructions explained to the pt, with a verbal understanding of the material including: as of today, stop taking all Aspirin (unless instructed by your doctor) and Other Aspirin containing products, Vitamins, Fish oils, and Herbal medications. Also stop all NSAIDS i.e. Advil, Ibuprofen, Motrin, Aleve, Anaprox, Naproxen, BC, Goody Powders, and all Supplements.  Pt also instructed to wear a mask and social distance if he goes out. The opportunity to ask questions was provided.

## 2021-11-30 ENCOUNTER — Ambulatory Visit (HOSPITAL_COMMUNITY): Payer: BLUE CROSS/BLUE SHIELD | Admitting: Anesthesiology

## 2021-11-30 ENCOUNTER — Encounter (HOSPITAL_COMMUNITY): Payer: Self-pay | Admitting: Orthopedic Surgery

## 2021-11-30 ENCOUNTER — Encounter (HOSPITAL_COMMUNITY)
Admission: RE | Disposition: A | Discharge status: Home / Self Care | Payer: Self-pay | Source: Home / Self Care | Attending: Orthopedic Surgery

## 2021-11-30 ENCOUNTER — Other Ambulatory Visit: Payer: Self-pay

## 2021-11-30 ENCOUNTER — Ambulatory Visit (HOSPITAL_COMMUNITY): Payer: BLUE CROSS/BLUE SHIELD

## 2021-11-30 ENCOUNTER — Ambulatory Visit (HOSPITAL_COMMUNITY)
Admission: RE | Admit: 2021-11-30 | Discharge: 2021-11-30 | Disposition: A | Discharge status: Home / Self Care | Payer: BLUE CROSS/BLUE SHIELD | Attending: Orthopedic Surgery | Admitting: Orthopedic Surgery

## 2021-11-30 DIAGNOSIS — Y9323 Activity, snow (alpine) (downhill) skiing, snow boarding, sledding, tobogganing and snow tubing: Secondary | ICD-10-CM | POA: Diagnosis not present

## 2021-11-30 DIAGNOSIS — S63642A Sprain of metacarpophalangeal joint of left thumb, initial encounter: Secondary | ICD-10-CM

## 2021-11-30 DIAGNOSIS — S5332XA Traumatic rupture of left ulnar collateral ligament, initial encounter: Secondary | ICD-10-CM | POA: Insufficient documentation

## 2021-11-30 DIAGNOSIS — W19XXXA Unspecified fall, initial encounter: Secondary | ICD-10-CM | POA: Insufficient documentation

## 2021-11-30 HISTORY — PX: TENDON TRANSPLANT: SHX2488

## 2021-11-30 HISTORY — PX: ULNAR COLLATERAL LIGAMENT REPAIR: SHX6159

## 2021-11-30 LAB — CBC
HCT: 46.9 % (ref 39.0–52.0)
Hemoglobin: 16 g/dL (ref 13.0–17.0)
MCH: 30.9 pg (ref 26.0–34.0)
MCHC: 34.1 g/dL (ref 30.0–36.0)
MCV: 90.5 fL (ref 80.0–100.0)
Platelets: 218 10*3/uL (ref 150–400)
RBC: 5.18 MIL/uL (ref 4.22–5.81)
RDW: 11.5 % (ref 11.5–15.5)
WBC: 4.7 10*3/uL (ref 4.0–10.5)
nRBC: 0 % (ref 0.0–0.2)

## 2021-11-30 SURGERY — REPAIR, LIGAMENT, ULNAR COLLATERAL
Anesthesia: General | Site: Thumb | Laterality: Left

## 2021-11-30 MED ORDER — PHENYLEPHRINE 80 MCG/ML (10ML) SYRINGE FOR IV PUSH (FOR BLOOD PRESSURE SUPPORT)
PREFILLED_SYRINGE | INTRAVENOUS | Status: DC | PRN
  Administered 2021-11-30: 80 ug via INTRAVENOUS

## 2021-11-30 MED ORDER — ONDANSETRON HCL 4 MG/2ML IJ SOLN
INTRAMUSCULAR | Status: DC | PRN
  Administered 2021-11-30: 4 mg via INTRAVENOUS

## 2021-11-30 MED ORDER — FENTANYL CITRATE (PF) 250 MCG/5ML IJ SOLN
INTRAMUSCULAR | Status: AC
  Filled 2021-11-30: qty 5

## 2021-11-30 MED ORDER — MIDAZOLAM HCL 2 MG/2ML IJ SOLN
INTRAMUSCULAR | Status: DC | PRN
  Administered 2021-11-30: 2 mg via INTRAVENOUS

## 2021-11-30 MED ORDER — OXYCODONE HCL 5 MG PO TABS
ORAL_TABLET | ORAL | Status: AC
  Administered 2021-11-30: 5 mg via ORAL
  Filled 2021-11-30: qty 1

## 2021-11-30 MED ORDER — ACETAMINOPHEN 10 MG/ML IV SOLN
INTRAVENOUS | Status: DC | PRN
  Administered 2021-11-30: 1000 mg via INTRAVENOUS

## 2021-11-30 MED ORDER — ACETAMINOPHEN 10 MG/ML IV SOLN
INTRAVENOUS | Status: AC
Start: 2021-11-30 — End: ?
  Filled 2021-11-30: qty 100

## 2021-11-30 MED ORDER — 0.9 % SODIUM CHLORIDE (POUR BTL) OPTIME
TOPICAL | Status: DC | PRN
  Administered 2021-11-30: 1000 mL

## 2021-11-30 MED ORDER — CHLORHEXIDINE GLUCONATE 0.12 % MT SOLN
15.0000 mL | Freq: Once | OROMUCOSAL | Status: AC
  Administered 2021-11-30: 15 mL via OROMUCOSAL
  Filled 2021-11-30: qty 15

## 2021-11-30 MED ORDER — DEXAMETHASONE SODIUM PHOSPHATE 10 MG/ML IJ SOLN
INTRAMUSCULAR | Status: DC | PRN
  Administered 2021-11-30: 5 mg via INTRAVENOUS

## 2021-11-30 MED ORDER — LACTATED RINGERS IV SOLN: INTRAVENOUS | Status: DC

## 2021-11-30 MED ORDER — AMISULPRIDE (ANTIEMETIC) 5 MG/2ML IV SOLN: 10.0000 mg | Freq: Once | INTRAVENOUS | Status: DC | PRN

## 2021-11-30 MED ORDER — CEFAZOLIN SODIUM-DEXTROSE 2-4 GM/100ML-% IV SOLN
2.0000 g | INTRAVENOUS | Status: AC
  Administered 2021-11-30: 2 g via INTRAVENOUS
  Filled 2021-11-30: qty 100

## 2021-11-30 MED ORDER — ACETAMINOPHEN 325 MG PO TABS: 325.0000 mg | ORAL_TABLET | ORAL | Status: DC | PRN

## 2021-11-30 MED ORDER — LIDOCAINE 2% (20 MG/ML) 5 ML SYRINGE
INTRAMUSCULAR | Status: DC | PRN
  Administered 2021-11-30: 40 mg via INTRAVENOUS

## 2021-11-30 MED ORDER — OXYCODONE HCL 5 MG PO TABS: 5.0000 mg | ORAL_TABLET | ORAL | 0 refills | Status: DC | PRN

## 2021-11-30 MED ORDER — LIDOCAINE 2% (20 MG/ML) 5 ML SYRINGE
INTRAMUSCULAR | Status: AC
  Filled 2021-11-30: qty 5

## 2021-11-30 MED ORDER — ACETAMINOPHEN 10 MG/ML IV SOLN: 1000.0000 mg | Freq: Once | INTRAVENOUS | Status: DC | PRN

## 2021-11-30 MED ORDER — FENTANYL CITRATE (PF) 250 MCG/5ML IJ SOLN
INTRAMUSCULAR | Status: DC | PRN
  Administered 2021-11-30 (×4): 50 ug via INTRAVENOUS

## 2021-11-30 MED ORDER — LIDOCAINE HCL 1 % IJ SOLN
INTRAMUSCULAR | Status: DC | PRN
  Administered 2021-11-30: 10 mL

## 2021-11-30 MED ORDER — FENTANYL CITRATE (PF) 100 MCG/2ML IJ SOLN
INTRAMUSCULAR | Status: AC
  Filled 2021-11-30: qty 2

## 2021-11-30 MED ORDER — ACETAMINOPHEN 160 MG/5ML PO SOLN: 325.0000 mg | ORAL | Status: DC | PRN

## 2021-11-30 MED ORDER — PROPOFOL 10 MG/ML IV BOLUS
INTRAVENOUS | Status: AC
  Filled 2021-11-30: qty 20

## 2021-11-30 MED ORDER — LIDOCAINE HCL 1 % IJ SOLN
INTRAMUSCULAR | Status: AC
  Filled 2021-11-30: qty 20

## 2021-11-30 MED ORDER — ONDANSETRON HCL 4 MG/2ML IJ SOLN
INTRAMUSCULAR | Status: AC
  Filled 2021-11-30: qty 2

## 2021-11-30 MED ORDER — MIDAZOLAM HCL 2 MG/2ML IJ SOLN
INTRAMUSCULAR | Status: AC
Start: 2021-11-30 — End: ?
  Filled 2021-11-30: qty 2

## 2021-11-30 MED ORDER — OXYCODONE HCL 5 MG PO TABS: 5.0000 mg | ORAL_TABLET | Freq: Once | ORAL | Status: AC | PRN

## 2021-11-30 MED ORDER — DEXAMETHASONE SODIUM PHOSPHATE 10 MG/ML IJ SOLN
INTRAMUSCULAR | Status: AC
  Filled 2021-11-30: qty 1

## 2021-11-30 MED ORDER — PROPOFOL 10 MG/ML IV BOLUS
INTRAVENOUS | Status: DC | PRN
  Administered 2021-11-30: 200 mg via INTRAVENOUS
  Administered 2021-11-30: 30 mg via INTRAVENOUS
  Administered 2021-11-30: 50 mg via INTRAVENOUS

## 2021-11-30 MED ORDER — OXYCODONE HCL 5 MG/5ML PO SOLN: 5.0000 mg | Freq: Once | ORAL | Status: AC | PRN

## 2021-11-30 MED ORDER — ORAL CARE MOUTH RINSE: 15.0000 mL | Freq: Once | OROMUCOSAL | Status: AC

## 2021-11-30 MED ORDER — FENTANYL CITRATE (PF) 100 MCG/2ML IJ SOLN
25.0000 ug | INTRAMUSCULAR | Status: DC | PRN
  Administered 2021-11-30 (×2): 25 ug via INTRAVENOUS

## 2021-11-30 MED ORDER — BUPIVACAINE HCL (PF) 0.25 % IJ SOLN
INTRAMUSCULAR | Status: AC
Start: 2021-11-30 — End: ?
  Filled 2021-11-30: qty 30

## 2021-11-30 SURGICAL SUPPLY — 68 items
ANCHOR DX SWIVELOCK SL 3.5X8.5 (Anchor) ×1 IMPLANT
ANCHOR REPAIR HAND WRIST (Anchor) ×1 IMPLANT
BLADE MINI RND TIP GREEN BEAV (BLADE) IMPLANT
BLADE SURG 15 STRL LF DISP TIS (BLADE) ×4 IMPLANT
BLADE SURG 15 STRL SS (BLADE)
BNDG ELASTIC 2X5.8 VLCR STR LF (GAUZE/BANDAGES/DRESSINGS) IMPLANT
BNDG ELASTIC 3X5.8 VLCR STR LF (GAUZE/BANDAGES/DRESSINGS) ×2 IMPLANT
BNDG ELASTIC 4X5.8 VLCR STR LF (GAUZE/BANDAGES/DRESSINGS) ×1 IMPLANT
BNDG ESMARK 4X9 LF (GAUZE/BANDAGES/DRESSINGS) ×3 IMPLANT
BNDG GAUZE ELAST 4 BULKY (GAUZE/BANDAGES/DRESSINGS) ×3 IMPLANT
BNDG PLASTER X FAST 4X5 WHT LF (CAST SUPPLIES) ×1 IMPLANT
CATH ROBINSON RED A/P 10FR (CATHETERS) IMPLANT
CHLORAPREP W/TINT 26 (MISCELLANEOUS) ×3 IMPLANT
CORD BIPOLAR FORCEPS 12FT (ELECTRODE) ×3 IMPLANT
COVER BACK TABLE 60X90IN (DRAPES) ×3 IMPLANT
COVER MAYO STAND STRL (DRAPES) ×3 IMPLANT
CUFF TOURN SGL QUICK 18X4 (TOURNIQUET CUFF) ×3 IMPLANT
DRAPE EXTREMITY T 121X128X90 (DISPOSABLE) ×2 IMPLANT
DRAPE OEC MINIVIEW 54X84 (DRAPES) ×1 IMPLANT
DRAPE SURG 17X23 STRL (DRAPES) ×3 IMPLANT
DRSG PAD ABDOMINAL 8X10 ST (GAUZE/BANDAGES/DRESSINGS) IMPLANT
GAUZE 4X4 16PLY ~~LOC~~+RFID DBL (SPONGE) IMPLANT
GAUZE SPONGE 4X4 12PLY STRL (GAUZE/BANDAGES/DRESSINGS) ×2 IMPLANT
GAUZE XEROFORM 1X8 LF (GAUZE/BANDAGES/DRESSINGS) ×2 IMPLANT
GLOVE BIO SURGEON STRL SZ7 (GLOVE) ×3 IMPLANT
GLOVE BIOGEL PI IND STRL 7.0 (GLOVE) ×2 IMPLANT
GLOVE BIOGEL PI INDICATOR 7.0 (GLOVE) ×1
GOWN STRL REUS W/ TWL LRG LVL3 (GOWN DISPOSABLE) ×2 IMPLANT
GOWN STRL REUS W/ TWL XL LVL3 (GOWN DISPOSABLE) ×2 IMPLANT
GOWN STRL REUS W/TWL LRG LVL3 (GOWN DISPOSABLE) ×1
GOWN STRL REUS W/TWL XL LVL3 (GOWN DISPOSABLE) ×1
LOOP VESSEL MAXI BLUE (MISCELLANEOUS) IMPLANT
NDL HYPO 25X1 1.5 SAFETY (NEEDLE) IMPLANT
NDL KEITH (NEEDLE) IMPLANT
NEEDLE HYPO 25X1 1.5 SAFETY (NEEDLE) IMPLANT
NEEDLE KEITH (NEEDLE) IMPLANT
NS IRRIG 1000ML POUR BTL (IV SOLUTION) ×3 IMPLANT
PACK BASIN DAY SURGERY FS (CUSTOM PROCEDURE TRAY) ×3 IMPLANT
PAD CAST 3X4 CTTN HI CHSV (CAST SUPPLIES) ×2 IMPLANT
PAD CAST 4YDX4 CTTN HI CHSV (CAST SUPPLIES) IMPLANT
PADDING CAST ABS 3INX4YD NS (CAST SUPPLIES)
PADDING CAST ABS 4INX4YD NS (CAST SUPPLIES)
PADDING CAST ABS COTTON 3X4 (CAST SUPPLIES) IMPLANT
PADDING CAST ABS COTTON 4X4 ST (CAST SUPPLIES) ×2 IMPLANT
PADDING CAST COTTON 3X4 STRL (CAST SUPPLIES)
PADDING CAST COTTON 4X4 STRL (CAST SUPPLIES) ×2
SLEEVE SCD COMPRESS KNEE MED (STOCKING) IMPLANT
SPIKE FLUID TRANSFER (MISCELLANEOUS) IMPLANT
SPLINT FIBERGLASS 4X30 (CAST SUPPLIES) ×2 IMPLANT
SPLINT PLASTER CAST XFAST 3X15 (CAST SUPPLIES) IMPLANT
SPLINT PLASTER XTRA FASTSET 3X (CAST SUPPLIES)
SUT ETHIBOND 3-0 V-5 (SUTURE) IMPLANT
SUT ETHILON 3 0 PS 1 (SUTURE) IMPLANT
SUT ETHILON 4 0 PS 2 18 (SUTURE) ×4 IMPLANT
SUT FIBERWIRE 3-0 18 TAPR NDL (SUTURE)
SUT FIBERWIRE 4-0 18 DIAM BLUE (SUTURE)
SUT MERSILENE 4 0 P 3 (SUTURE) ×1 IMPLANT
SUT MNCRL AB 4-0 PS2 18 (SUTURE) IMPLANT
SUT MON AB 5-0 PS2 18 (SUTURE) IMPLANT
SUT PROLENE 6 0 P 1 18 (SUTURE) IMPLANT
SUT SILK 2 0 PERMA HAND 18 BK (SUTURE) IMPLANT
SUT SUPRAMID 4-0 (SUTURE) IMPLANT
SUTURE FIBERWR 3-0 18 TAPR NDL (SUTURE) IMPLANT
SUTURE FIBERWR 4-0 18 DIA BLUE (SUTURE) IMPLANT
SYR BULB EAR ULCER 3OZ GRN STR (SYRINGE) ×3 IMPLANT
SYR CONTROL 10ML LL (SYRINGE) IMPLANT
TOWEL GREEN STERILE FF (TOWEL DISPOSABLE) ×6 IMPLANT
UNDERPAD 30X36 HEAVY ABSORB (UNDERPADS AND DIAPERS) ×3 IMPLANT

## 2021-11-30 NOTE — Anesthesia Postprocedure Evaluation (Signed)
Anesthesia Post Note  Patient: Shawn Parker  Procedure(s) Performed: LEFT THUMB ULNAR COLLATERAL LIGAMENT RECONSTRUCTION (Left: Thumb) TENDON TRANSPLANT (Left: Arm Lower)     Patient location during evaluation: PACU Anesthesia Type: General Level of consciousness: awake and alert Pain management: pain level controlled Vital Signs Assessment: post-procedure vital signs reviewed and stable Respiratory status: spontaneous breathing, nonlabored ventilation, respiratory function stable and patient connected to nasal cannula oxygen Cardiovascular status: blood pressure returned to baseline and stable Postop Assessment: no apparent nausea or vomiting Anesthetic complications: no   No notable events documented.  Last Vitals:  Vitals:   11/30/21 1215 11/30/21 1230  BP: (!) 164/78 (!) 153/69  Pulse: (!) 54 60  Resp: 12 17  Temp:  36.6 C  SpO2: 98% 98%    Last Pain:  Vitals:   11/30/21 1230  PainSc: 4                  Shelton Silvas

## 2021-11-30 NOTE — Anesthesia Procedure Notes (Signed)
Procedure Name: LMA Insertion Date/Time: 11/30/2021 9:38 AM Performed by: Aundria Rud, CRNA Pre-anesthesia Checklist: Patient identified, Emergency Drugs available, Suction available and Patient being monitored Patient Re-evaluated:Patient Re-evaluated prior to induction Oxygen Delivery Method: Circle System Utilized Preoxygenation: Pre-oxygenation with 100% oxygen Induction Type: IV induction Ventilation: Mask ventilation without difficulty LMA: LMA inserted LMA Size: 4.0 Number of attempts: 1 Airway Equipment and Method: Bite block Placement Confirmation: positive ETCO2 Tube secured with: Tape Dental Injury: Teeth and Oropharynx as per pre-operative assessment

## 2021-11-30 NOTE — Interval H&P Note (Signed)
History and Physical Interval Note:  11/30/2021 9:19 AM  Blanchard Mane  has presented today for surgery, with the diagnosis of LEFT THUMB ULNAR COLLATERAL LIGAMENT RUPTURE.  The various methods of treatment have been discussed with the patient and family. After consideration of risks, benefits and other options for treatment, the patient has consented to  Procedure(s): LEFT THUMB ULNAR COLLATERAL LIGAMENT REPAIR VS RECONSTRUCTION (Left) as a surgical intervention.  The patient's history has been reviewed, patient examined, no change in status, stable for surgery.  I have reviewed the patient's chart and labs.  Questions were answered to the patient's satisfaction.     Shawn Parker

## 2021-11-30 NOTE — Interval H&P Note (Deleted)
History and Physical Interval Note:  11/30/2021 7:22 AM  Shawn Parker  has presented today for surgery, with the diagnosis of LEFT THUMB ULNAR COLLATERAL LIGAMENT RUPTURE.  The various methods of treatment have been discussed with the patient and family. After consideration of risks, benefits and other options for treatment, the patient has consented to  Procedure(s): LEFT THUMB ULNAR COLLATERAL LIGAMENT REPAIR VS RECONSTRUCTION (Left) as a surgical intervention.  The patient's history has been reviewed, patient examined, no change in status, stable for surgery.  I have reviewed the patient's chart and labs.  Questions were answered to the patient's satisfaction.     Shawn Parker

## 2021-11-30 NOTE — Discharge Instructions (Signed)
  Shawn Parker, M.D. Hand Surgery  POST-OPERATIVE DISCHARGE INSTRUCTIONS   PRESCRIPTIONS: You may have been given a prescription to be taken as directed for post-operative pain control.  You may also take over the counter ibuprofen/aleve and tylenol for pain. Take this as directed on the packaging. Do not exceed 3000 mg tylenol/acetaminophen in 24 hours.  Ibuprofen 600-800 mg (3-4) tablets by mouth every 6 hours as needed for pain.  OR Aleve 2 tablets by mouth every 12 hours (twice daily) as needed for pain.  AND/OR Tylenol 1000 mg (2 tablets) every 8 hours as needed for pain.  Please use your pain medication carefully, as refills are limited and you may not be provided with one.  As stated above, please use over the counter pain medicine - it will also be helpful with decreasing your swelling.    ANESTHESIA: After your surgery, post-surgical discomfort or pain is likely. This discomfort can last several days to a few weeks. At certain times of the day your discomfort may be more intense.   Did you receive a nerve block?  A nerve block can provide pain relief for one hour to two days after your surgery. As long as the nerve block is working, you will experience little or no sensation in the area the surgeon operated on.  As the nerve block wears off, you will begin to experience pain or discomfort. It is very important that you begin taking your prescribed pain medication before the nerve block fully wears off. Treating your pain at the first sign of the block wearing off will ensure your pain is better controlled and more tolerable when full-sensation returns. Do not wait until the pain is intolerable, as the medicine will be less effective. It is better to treat pain in advance than to try and catch up.   General Anesthesia:  If you did not receive a nerve block during your surgery, you will need to start taking your pain medication shortly after your surgery and should continue  to do so as prescribed by your surgeon.     ICE AND ELEVATION: You may use ice for the first 48-72 hours, but it is not critical.   Motion of your fingers is very important to decrease the swelling.  Elevation, as much as possible for the next 48 hours, is critical for decreasing swelling as well as for pain relief. Elevation means when you are seated or lying down, you hand should be at or above your heart. When walking, the hand needs to be at or above the level of your elbow.  If the bandage gets too tight, it may need to be loosened. Please contact our office and we will instruct you in how to do this.    SURGICAL BANDAGES:  Keep your dressing and/or splint clean and dry at all times.  Do not remove until you are seen again in the office.  If careful, you may place a plastic bag over your bandage and tape the end to shower, but be careful, do not get your bandages wet.     HAND THERAPY:  You may not need any. If you do, we will begin this at your follow up visit in the clinic.    ACTIVITY AND WORK: You are encouraged to move any fingers which are not in the bandage.  Light use of the fingers is allowed to assist the other hand with daily hygiene and eating, but strong gripping or lifting is often uncomfortable and   should be avoided.    [facility] [address] [place],  [place]  [address] [phone]

## 2021-11-30 NOTE — Transfer of Care (Signed)
Immediate Anesthesia Transfer of Care Note  Patient: Shawn Parker  Procedure(s) Performed: LEFT THUMB ULNAR COLLATERAL LIGAMENT RECONSTRUCTION (Left: Thumb) TENDON TRANSPLANT (Left: Arm Lower)  Patient Location: PACU  Anesthesia Type:General  Level of Consciousness: awake, alert , oriented and patient cooperative  Airway & Oxygen Therapy: Patient Spontanous Breathing  Post-op Assessment: Report given to RN, Post -op Vital signs reviewed and stable and Patient moving all extremities X 4  Post vital signs: Reviewed and stable  Last Vitals:  Vitals Value Taken Time  BP    Temp    Pulse 65 11/30/21 1202  Resp 13 11/30/21 1202  SpO2 99 % 11/30/21 1202  Vitals shown include unvalidated device data.  Last Pain:  Vitals:   11/30/21 0811  PainSc: 1       Patients Stated Pain Goal: 0 (11/30/21 0811)  Complications: No notable events documented.

## 2021-11-30 NOTE — Anesthesia Preprocedure Evaluation (Signed)
Anesthesia Evaluation    Reviewed: Allergy & Precautions, Patient's Chart, lab work & pertinent test results  Airway Mallampati: I  TM Distance: >3 FB Neck ROM: Full    Dental  (+) Teeth Intact, Dental Advisory Given   Pulmonary neg pulmonary ROS,    breath sounds clear to auscultation       Cardiovascular negative cardio ROS   Rhythm:Regular Rate:Normal     Neuro/Psych negative neurological ROS  negative psych ROS   GI/Hepatic negative GI ROS, Neg liver ROS,   Endo/Other  negative endocrine ROS  Renal/GU negative Renal ROS     Musculoskeletal negative musculoskeletal ROS (+)   Abdominal Normal abdominal exam  (+)   Peds  Hematology negative hematology ROS (+)   Anesthesia Other Findings   Reproductive/Obstetrics                             Anesthesia Physical Anesthesia Plan  ASA: 1  Anesthesia Plan: General   Post-op Pain Management:    Induction: Intravenous  PONV Risk Score and Plan: 3 and Ondansetron, Dexamethasone and Midazolam  Airway Management Planned: LMA  Additional Equipment: None  Intra-op Plan:   Post-operative Plan: Extubation in OR  Informed Consent: I have reviewed the patients History and Physical, chart, labs and discussed the procedure including the risks, benefits and alternatives for the proposed anesthesia with the patient or authorized representative who has indicated his/her understanding and acceptance.     Dental advisory given  Plan Discussed with: CRNA  Anesthesia Plan Comments:         Anesthesia Quick Evaluation

## 2021-11-30 NOTE — Brief Op Note (Signed)
11/30/2021  12:03 PM  PATIENT:  Shawn Parker  51 y.o. male  PRE-OPERATIVE DIAGNOSIS:  LEFT THUMB ULNAR COLLATERAL LIGAMENT RUPTURE  POST-OPERATIVE DIAGNOSIS:  LEFT THUMB ULNAR COLLATERAL LIGAMENT RUPTURE  PROCEDURE:  Procedure(s): LEFT THUMB ULNAR COLLATERAL LIGAMENT RECONSTRUCTION (Left) TENDON TRANSPLANT (Left)  SURGEON:  Surgeon(s) and Role:    * Sherilyn Cooter, MD - Primary  PHYSICIAN ASSISTANT:   ASSISTANTS: none   ANESTHESIA:   general  EBL:  5 mL  BLOOD ADMINISTERED:none  DRAINS: none   LOCAL MEDICATIONS USED:  LIDOCAINE   SPECIMEN:  No Specimen  DISPOSITION OF SPECIMEN:  N/A  COUNTS:  YES  TOURNIQUET:   Total Tourniquet Time Documented: Upper Arm (Left) - 100 minutes Total: Upper Arm (Left) - 100 minutes   DICTATION: .Dragon Dictation  PLAN OF CARE: Discharge to home after PACU  PATIENT DISPOSITION:  PACU - hemodynamically stable.   Delay start of Pharmacological VTE agent (>24hrs) due to surgical blood loss or risk of bleeding: not applicable

## 2021-11-30 NOTE — Op Note (Signed)
Date of Surgery: 11/30/2021  INDICATIONS: Patient is a 51 y.o.-year-old male with a left thumb UCL rupture that occurred approximately two months ago.  Recent MRI suggested a complete tear with proximal retraction and possible Stener lesion.  Risks, benefits, and alternatives to surgery were again discussed with the patient in the preoperative area. The patient wishes to proceed with surgery.  Informed consent was signed after our discussion.   PREOPERATIVE DIAGNOSIS:  Left thumb UCL rupture  POSTOPERATIVE DIAGNOSIS: Same.  PROCEDURE:  Left thumb UCL reconstruction Harvest of FCR autograft   SURGEON: Audria Nine, M.D.  ASSIST:   ANESTHESIA:  general  IV FLUIDS AND URINE: See anesthesia.  ESTIMATED BLOOD LOSS: % mL.  IMPLANTS:  Implant Name Type Inv. Item Serial No. Manufacturer Lot No. LRB No. Used Action  [facility] REPAIR HAND WRIST - [id number] Anchor ANCHOR REPAIR HAND WRIST  [employer] [id number] Left 1 Implanted  ANCHOR DX SWIVELOCK SL 3.5X8.5 - [id number] Anchor ANCHOR DX SWIVELOCK SL 3.5X8.5  [employer] [id number] Left 1 Implanted     DRAINS: None  COMPLICATIONS: None  DESCRIPTION OF PROCEDURE: The patient was met in the preoperative holding area where the surgical site was marked and the consent form was verified.  The patient was then taken to the operating room and transferred to the operating table.  All bony prominences were well padded.  A tourniquet was applied to the left upper arm.  General endotracheal anesthesia was induced.  The operative extremity was prepped and draped in the usual and sterile fashion.  A formal time-out was performed to confirm that this was the correct patient, surgery, side, and site.   Following timeout, the limb was gently exsanguinated and the tourniquet inflated to 250 mmHg.  A curvilinear incision was made at the ulnar aspect of the thumb from dorsal and proximal to volar and distal.  The skin was incised.  Blunt dissection was used to  develop full-thickness skin flaps.  Cutaneous nerve branches were identified, mobilized, and protected.  The abductor aponeurosis was identified.  The aponeurosis was divided by the 5-10 mm volar to the extensor apparatus to allow for later repair.  The collateral ligament was incised along the dorsal margin of the collateral ligament.  The collateral ligament was identified and was found to be severely retracted and scarred.  He was unable to be adequately mobilized.  The insertion on the volar base of the proximal phalanx was identified and cleared of any soft tissue.  At this point, given the inability to directly repair the collateral ligament, the decision was made to proceed with ligament reconstruction.  Because the patient did not have a palmaris longus tendon, I opted to harvest a strip of the FCR tendon.  A transverse incision was made at the wrist flexion crease directly over the FCR.  A counterincision approximately 8 cm proximal was made over the FCR tendon.  The skin subcutaneous tissue was divided at both sites.  A hemostat was placed underneath the FCR tendon proximally.  A small strip was developed in the tendon and a Carroll tendon passer was passed from distal to proximal.  The strip was grasped and the tendon passer pulled distally to create a long strip of tendon.  The tendon was approximately 8 cm in length.  Given the nature of the anchor, the tendon needed to be 2 mm in width.  The tendon was trimmed appropriately and found to fit nicely in the tendon sizer.  A whipstitch was placed  in both ends of the tendon to allow for attachment of the tendon to the bone anchor vice.  At this point I turned my attention towards creating the bone tunnels for the anchors.  K wires were placed at the origin and insertion of the ulnar collateral ligament.  The wire was placed at the head of the metacarpal just dorsal to the midline.  Was placed at the base of the proximal phalanx just distal to the articular  surface and just dorsal to the volar base.  Care was taken to ensure that the articular surface or volar base would not be blown out when the K wire was overdrilled.  The larger drill was used to create the bone tunnels to accommodate with the anchoring graft.  The bone tunnels were thoroughly irrigated.  At this point the graft was inserted through the fork of the first swivel lock anchor followed by the internal brace suture.  With slow, steady pressure the anchor was inserted into the drill tunnel at the metacarpal head.  The screw was advanced and the anchor was fully seated.  At this point, the MCP joint was held in approximately 30 degrees of flexion and slight ulnar deviation.  The other end of the graft and suture tape was held through the fork of the swivel lock anchor.  With firm pressure the anchor was seated at the bone tunnel.  The screw was advanced and the anchor was fully seated.  The graft appeared to be in appropriate position.  The thumb MCP joint was stable to ulnar stress.  At this point, the wound was thoroughly irrigated with copious sterile saline.  The capsule was repaired using a 4-0 FiberWire suture.  The abductor aponeurosis was then reapproximated using a 4-0 Mersilene suture.  The skin was closed with 4-0 nylon suture in horizontal mattress fashion.  The FCR harvest sites were also irrigated and closed using 4-0 nylon sutures.  The tourniquet was let down hemostasis was achieved with direct pressure over the wound.  Wound dressed with Xeroform, folded Kerlix, cast padding, and a well-padded thumb spica splint was applied.  At this point, the patient was reversed of anesthesia and extubated uneventfully.  He was transferred the postoperative bed.  All counts were correct x2 at the end of the procedure.  He was taken to the PACU in stable condition.   POSTOPERATIVE PLAN: He will be discharged home with appropriate pain medication and discharge instructions.  I will see him back  in the office in 10 to 14 days for his first postop visit.  He will be referred to therapy to begin the rehab protocol.  Audria Nine, MD 12:57 PM

## 2021-12-03 ENCOUNTER — Encounter (HOSPITAL_COMMUNITY): Payer: Self-pay | Admitting: Orthopedic Surgery

## 2021-12-06 ENCOUNTER — Other Ambulatory Visit: Payer: Self-pay | Admitting: Orthopedic Surgery

## 2021-12-06 ENCOUNTER — Telehealth: Payer: Self-pay | Admitting: Orthopedic Surgery

## 2021-12-06 ENCOUNTER — Ambulatory Visit (INDEPENDENT_AMBULATORY_CARE_PROVIDER_SITE_OTHER): Payer: BLUE CROSS/BLUE SHIELD | Admitting: Orthopedic Surgery

## 2021-12-06 ENCOUNTER — Encounter: Payer: Self-pay | Admitting: Orthopedic Surgery

## 2021-12-06 DIAGNOSIS — S63642A Sprain of metacarpophalangeal joint of left thumb, initial encounter: Secondary | ICD-10-CM

## 2021-12-06 MED ORDER — OXYCODONE HCL 5 MG PO TABS: 5.0000 mg | ORAL_TABLET | Freq: Four times a day (QID) | ORAL | 0 refills | Status: AC | PRN

## 2021-12-06 NOTE — Telephone Encounter (Signed)
Patient called advised he is flying out midday tomorrow and he went to the pharmacy and the Rx was not received at the pharmacy. (Oxycodone)  The number to contact patient is [phone]

## 2021-12-06 NOTE — Progress Notes (Signed)
   Post-Op Visit Note   Patient: Shawn Parker           Date of Birth: [birth year]           MRN: [id number] Visit Date: 12/06/2021 PCP: Pcp, No   Assessment & Plan:  Chief Complaint:  Chief Complaint  Patient presents with   Left Thumb - Routine Post Op   Visit Diagnoses:  1. Rupture of ulnar collateral ligament of left thumb, initial encounter     Plan: Patient in 5 days s/p left thumb UCL reconstruction.  He presents today just for a new splint.  He is going out of town for a week and will see me again when he returns from his trip.  We will remove the sutures at that time.   Follow-Up Instructions: No follow-ups on file.   Orders:  No orders of the defined types were placed in this encounter.  No orders of the defined types were placed in this encounter.   Imaging: No results found.  PMFS History: Patient Active Problem List   Diagnosis Date Noted   Rupture of UCL of left thumb 11/23/2021   Closed displaced fracture of distal phalanx of right ring finger 10/30/2021   No past medical history on file.  No family history on file.  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   Procedure: TENDON TRANSPLANT;  Surgeon: Sherilyn Cooter, MD;  Location: [facility];  Service: Orthopedics;  Laterality: Left;   TONSILLECTOMY AND ADENOIDECTOMY     ULNAR COLLATERAL LIGAMENT REPAIR Left 11/30/2021   Procedure: LEFT THUMB ULNAR COLLATERAL LIGAMENT RECONSTRUCTION;  Surgeon: Sherilyn Cooter, MD;  Location: [facility];  Service: Orthopedics;  Laterality: Left;   Social History   Occupational History   Not on file  Tobacco Use   Smoking status: Never   Smokeless tobacco: Never  Vaping Use   Vaping Use: Never used  Substance and Sexual Activity   Alcohol use: Yes    Comment: occasional   Drug use: Never   Sexual activity: Not on file

## 2021-12-17 ENCOUNTER — Ambulatory Visit (INDEPENDENT_AMBULATORY_CARE_PROVIDER_SITE_OTHER): Payer: BLUE CROSS/BLUE SHIELD | Admitting: Rehabilitative and Restorative Service Providers"

## 2021-12-17 ENCOUNTER — Encounter: Payer: Self-pay | Admitting: Rehabilitative and Restorative Service Providers"

## 2021-12-17 ENCOUNTER — Other Ambulatory Visit: Payer: Self-pay

## 2021-12-17 ENCOUNTER — Ambulatory Visit (INDEPENDENT_AMBULATORY_CARE_PROVIDER_SITE_OTHER): Payer: BLUE CROSS/BLUE SHIELD | Admitting: Orthopedic Surgery

## 2021-12-17 DIAGNOSIS — M25542 Pain in joints of left hand: Secondary | ICD-10-CM

## 2021-12-17 DIAGNOSIS — R600 Localized edema: Secondary | ICD-10-CM

## 2021-12-17 DIAGNOSIS — R278 Other lack of coordination: Secondary | ICD-10-CM | POA: Diagnosis not present

## 2021-12-17 DIAGNOSIS — S63642A Sprain of metacarpophalangeal joint of left thumb, initial encounter: Secondary | ICD-10-CM

## 2021-12-17 DIAGNOSIS — M6281 Muscle weakness (generalized): Secondary | ICD-10-CM | POA: Diagnosis not present

## 2021-12-17 DIAGNOSIS — M25642 Stiffness of left hand, not elsewhere classified: Secondary | ICD-10-CM

## 2021-12-17 NOTE — Progress Notes (Signed)
   Post-Op Visit Note   Patient: Shawn Parker           Date of Birth: [birth year]           MRN: [id number] Visit Date: 12/17/2021 PCP: Pcp, No   Assessment & Plan:  Chief Complaint:  Chief Complaint  Patient presents with   Left Thumb - Routine Post Op   Visit Diagnoses:  1. Rupture of ulnar collateral ligament of left thumb, initial encounter     Plan: Patient is 2 weeks status post left thumb UCL reconstruction with FCR autograft.  He is doing well postoperatively.  He did remove his splint at some point since his last visit and has been wearing a fabric removable thumb spica brace.  The incisions are clean, dry, well approximated.  The sutures were removed.  I will have him see therapy today to have a thermoplastic brace made as I am concerned about maintaining stability of his thumb between therapy sessions.  I can see him back in 3 weeks or so to see how is progressing with therapy.  Follow-Up Instructions: No follow-ups on file.   Orders:  Orders Placed This Encounter  Procedures   Ambulatory referral to Occupational Therapy   No orders of the defined types were placed in this encounter.   Imaging: No results found.  PMFS History: Patient Active Problem List   Diagnosis Date Noted   Rupture of UCL of left thumb 11/23/2021   Closed displaced fracture of distal phalanx of right ring finger 10/30/2021   No past medical history on file.  No family history on file.  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   Procedure: TENDON TRANSPLANT;  Surgeon: Marlyne Beards, MD;  Location: [facility];  Service: Orthopedics;  Laterality: Left;   TONSILLECTOMY AND ADENOIDECTOMY     ULNAR COLLATERAL LIGAMENT REPAIR Left 11/30/2021   Procedure: LEFT THUMB ULNAR COLLATERAL LIGAMENT RECONSTRUCTION;  Surgeon: Marlyne Beards, MD;  Location: [facility];  Service: Orthopedics;  Laterality: Left;   Social History   Occupational History   Not on  file  Tobacco Use   Smoking status: Never   Smokeless tobacco: Never  Vaping Use   Vaping Use: Never used  Substance and Sexual Activity   Alcohol use: Yes    Comment: occasional   Drug use: Never   Sexual activity: Not on file

## 2021-12-17 NOTE — Therapy (Signed)
OUTPATIENT OCCUPATIONAL THERAPY ORTHO EVALUATION  Patient Name: Shawn Parker MRN: [id number] DOB:[birth year], 51 y.o., male Today's Date: 12/17/2021  PCP: N/A REFERRING PROVIDER: Dr. Sherilyn Cooter    OT End of Session - 12/17/21 1349     Visit Number 1    Number of Visits 14    Date for OT Re-Evaluation 02/08/22    OT Start Time 1301    OT Stop Time 1351    OT Time Calculation (min) 50 min    Equipment Utilized During Treatment orhtotic materials    Activity Tolerance Patient tolerated treatment well;No increased pain;Patient limited by fatigue;Patient limited by pain    Behavior During Therapy [facility] for tasks assessed/performed             History reviewed. No pertinent past medical history. 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   Procedure: TENDON TRANSPLANT;  Surgeon: Sherilyn Cooter, MD;  Location: [place];  Service: Orthopedics;  Laterality: Left;   TONSILLECTOMY AND ADENOIDECTOMY     ULNAR COLLATERAL LIGAMENT REPAIR Left 11/30/2021   Procedure: LEFT THUMB ULNAR COLLATERAL LIGAMENT RECONSTRUCTION;  Surgeon: Sherilyn Cooter, MD;  Location: [place];  Service: Orthopedics;  Laterality: Left;   Patient Active Problem List   Diagnosis Date Noted   Rupture of UCL of left thumb 11/23/2021   Closed displaced fracture of distal phalanx of right ring finger 10/30/2021    ONSET DATE: DOS 11/30/21  REFERRING DIAG: B51.025E (ICD-10-CM) - Rupture of ulnar collateral ligament of left thumb, initial encounter  THERAPY DIAG:  Pain in joint of left hand - Plan: Ot plan of care cert/re-cert  Localized edema - Plan: Ot plan of care cert/re-cert  Muscle weakness (generalized) - Plan: Ot plan of care cert/re-cert  Other lack of coordination - Plan: Ot plan of care cert/re-cert  Stiffness of left hand, not elsewhere classified - Plan: Ot plan of care cert/re-cert  Rationale for Evaluation and Treatment Rehabilitation  SUBJECTIVE:    SUBJECTIVE STATEMENT: 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 He states only mild pain now, but interesting in getting back to sports/leisure ASAP.    PERTINENT HISTORY: Per MD: "Left thumb UCL reconstruction"  PRECAUTIONS: 2+ weeks post sx now. Start light AROM at 4 weeks, AA/PROM at 5 weeks, light PRE 6+ weeks if stable, light daily activities at 8-10 weeks and full use 10-12 weeks, consider longer for difficult rock climbing actives.   WEIGHT BEARING RESTRICTIONS Yes NWB in left hand now   PAIN:  Are you having pain? Yes Rating: 1.5/10 at rest   FALLS: Has patient fallen in last 6 months? No  LIVING ENVIRONMENT: Lives with: lives with their family, lives with their spouse, and lives with their son   PLOF: Independent  PATIENT GOALS To get hand/arm stable and strong for return to sporting activities   OBJECTIVE:   HAND DOMINANCE: Left  ADLs: Overall ADLs: States decreased ability to grab, hold household objects, pain and inability to open containers, perform all FMS tasks, etc.    FUNCTIONAL OUTCOME MEASURES: Eval: Patient Specific Functional Scale: 1 (climbing, writing, washing/hygiene)  UPPER EXTREMITY ROM     Active ROM Right eval Left eval  Wrist flexion 74 59  Wrist extension 78 55  Wrist ulnar deviation    Wrist radial deviation    Wrist pronation    Wrist supination    (Blank rows = not tested)  Active ROM Left  eval  Thumb MCP (0-60) NT  Thumb IP (0-80) ~(-10) - 20*  Thumb Radial abd/add (0-55)  NT  Thumb Palmar abd/add (0-45)  NT  Thumb Opposition to Small Finger  unable  (Blank rows = not tested)   UPPER EXTREMITY MMT:    12/17/21: NT at eval, but grossly weak and painful now, unsafe  MMT Left eval  Shoulder flexion   Shoulder abduction   Shoulder adduction   Shoulder extension   Shoulder internal rotation   Shoulder external rotation   Middle trapezius   Lower trapezius    Elbow flexion   Elbow extension   Wrist flexion   Wrist extension   Wrist ulnar deviation   Wrist radial deviation   Wrist pronation   Wrist supination   (Blank rows = not tested)  HAND FUNCTION: Eval: decreased ability to pinch and grip, oppose, etc., now after injury and sx  Grip strength Left: TBD lbs   COORDINATION: Eval: decreased coordination in terms of GMS and FMS in left hand now Box and Blocks Test: TBD Blocks today (TBD is [facility]); 9 Hole Peg Test Right: TBDsec, Left: TBD sec   SENSATION: Eval: Light touch intact today, though diminished around sx area    EDEMA:   Eval:  Mildly swollen in left hand and thumb today  COGNITION: Overall cognitive status: WFL for evaluation today   OBSERVATIONS:   Eval: sx areas are healing well  TODAY'S TREATMENT:  Eval 12/17/21:  Custom orthotic fabrication was indicated due to pt's healing UCL repair of lt thumb and need for safe, functional positioning. OT fabricated custom hand-based thumb spica orthotic with IP J free, in radial and palmar abduction ~45* bend on CMP J for pt today to protect healing structures. It fit well with no areas of pressure, pt states a comfortable fit. Pt was educated on the wearing schedule, to call or come in ASAP if it is causing any irritation or is not achieving desired function. It will be checked/adjusted in upcoming sessions, as needed. Pt states understanding.   OT also edu on what to expect in long-term recovery, time periods, self-care and not doing anything stressful to joint (especially in radial deviation) for at least 6+ weeks and no major stress for 10-12 weeks.  Also edu for icing for swelling/pain; 4-6xday performing:  light scar massages (~2 mins total) around all sx areas (ok to use lotion, etc.) try to gently get surface of skin moving once well healed.  move wrist back/forth to tension points 10-15x, forearm (palm up/down) to tension 10-15x, fingers in light full fist, thumb tip bending to  tension within brace.      No lifting more than 0.5# for now (pencils ok), NO repetitive motions or painful activities. He states understanding all directions.    PATIENT EDUCATION: Education details: See tx section above for details  Person educated: Patient Education method: Verbal Instruction, Teach back, Handouts  Education comprehension: States and demonstrates understanding, Additional Education required    HOME EXERCISE PROGRAM: See tx section above for details   GOALS: Goals reviewed with patient? Yes   SHORT TERM GOALS: (STG required if POC>30 days)  Pt will obtain protective, custom orthotic. Target date: 12/17/21 Goal status: MET  2.  Pt will demo/state understanding of initial HEP to improve pain levels and prerequisite motion. Target date: 01/04/22 Goal status: INITIAL   LONG TERM GOALS:  Pt will improve functional ability by decreased impairment per assessment from 1 to 7 or better, for  better quality of life. Target date: 02/15/22 Goal status: INITIAL  2.  Pt will improve grip strength in left dom hand to at least 50lbs for functional use at home and in IADLs. Target date: 02/15/22 Goal status: INITIAL  3.  Pt will improve A/ROM in Lt wrist flex and ext to at least 70* each, to have functional motion for tasks like reach and grasp.  Target date: 02/15/22 Goal status: INITIAL  4.  Pt will improve strength in whole left arm to at least 4+/5 MMT to have increased functional ability to carry out selfcare and higher-level homecare tasks with no difficulty. Target date: 02/15/22 Goal status: INITIAL   ASSESSMENT:  CLINICAL IMPRESSION: Patient is a 51 y.o. male who was seen today for occupational therapy evaluation for left thumb UCL repair, soreness, weakness and decreased fnl ability.   PERFORMANCE DEFICITS in functional skills including ADLs, IADLs, coordination, dexterity, edema, ROM, strength, pain, fascial restrictions, flexibility, FMC, GMC, body  mechanics, endurance, decreased knowledge of precautions, and UE functional use, cognitive skills including problem solving and safety awareness, and psychosocial skills including coping strategies, environmental adaptation, habits, and routines and behaviors.   IMPAIRMENTS are limiting patient from ADLs, IADLs, work, play, leisure, and social participation.   COMORBIDITIES has no other co-morbidities that affects occupational performance. Patient will benefit from skilled OT to address above impairments and improve overall function.  MODIFICATION OR ASSISTANCE TO COMPLETE EVALUATION: No modification of tasks or assist necessary to complete an evaluation.  OT OCCUPATIONAL PROFILE AND HISTORY: Problem focused assessment: Including review of records relating to presenting problem.  CLINICAL DECISION MAKING: LOW - limited treatment options, no task modification necessary  REHAB POTENTIAL: Excellent  EVALUATION COMPLEXITY: Low      PLAN: OT FREQUENCY: 1-2x/week  OT DURATION: 8 weeks  PLANNED INTERVENTIONS: self care/ADL training, therapeutic exercise, therapeutic activity, neuromuscular re-education, manual therapy, scar mobilization, passive range of motion, splinting, ultrasound, fluidotherapy, compression bandaging, moist heat, cryotherapy, contrast bath, patient/family education, and coping strategies training  RECOMMENDED OTHER SERVICES: none now   CONSULTED AND AGREED WITH PLAN OF CARE: Patient  PLAN FOR NEXT SESSION: Check orthotic and initial HEP, upgrade to thumb AROM at [facility] and MCP at 4 weeks out & progress per protocols    Benito Mccreedy, OTR/L, CHT 12/17/2021, 5:29 PM

## 2021-12-24 ENCOUNTER — Ambulatory Visit (INDEPENDENT_AMBULATORY_CARE_PROVIDER_SITE_OTHER): Payer: BLUE CROSS/BLUE SHIELD | Admitting: Rehabilitative and Restorative Service Providers"

## 2021-12-24 ENCOUNTER — Encounter: Payer: Self-pay | Admitting: Rehabilitative and Restorative Service Providers"

## 2021-12-24 DIAGNOSIS — M6281 Muscle weakness (generalized): Secondary | ICD-10-CM | POA: Diagnosis not present

## 2021-12-24 DIAGNOSIS — R278 Other lack of coordination: Secondary | ICD-10-CM | POA: Diagnosis not present

## 2021-12-24 DIAGNOSIS — M25642 Stiffness of left hand, not elsewhere classified: Secondary | ICD-10-CM

## 2021-12-24 DIAGNOSIS — M25542 Pain in joints of left hand: Secondary | ICD-10-CM

## 2021-12-24 DIAGNOSIS — R600 Localized edema: Secondary | ICD-10-CM | POA: Diagnosis not present

## 2022-01-07 ENCOUNTER — Ambulatory Visit (INDEPENDENT_AMBULATORY_CARE_PROVIDER_SITE_OTHER): Payer: BLUE CROSS/BLUE SHIELD | Admitting: Orthopedic Surgery

## 2022-01-07 DIAGNOSIS — S63642A Sprain of metacarpophalangeal joint of left thumb, initial encounter: Secondary | ICD-10-CM

## 2022-01-07 NOTE — Progress Notes (Signed)
   Post-Op Visit Note   Patient: Shawn Parker           Date of Birth: 1970-10-29           MRN: [id number] Visit Date: 01/07/2022 PCP: Pcp, No   Assessment & Plan:  Chief Complaint:  Chief Complaint  Patient presents with   Left Thumb - Routine Post Op   Visit Diagnoses:  1. Rupture of ulnar collateral ligament of left thumb, initial encounter     Plan: Patient is 5 and half weeks status post left thumb UCL reconstruction with FCR autograft and internal brace.  He is doing well postoperatively.  He has been working with therapy.  He has significant stiffness at the thumb MCP joint but is working on range of motion.  He is able to oppose to around the small finger proximal phalanx.  His reconstructed UCL seems stable with no laxity.  I can see him back in a month of therapy to see how he is progressing.  Follow-Up Instructions: No follow-ups on file.   Orders:  No orders of the defined types were placed in this encounter.  No orders of the defined types were placed in this encounter.   Imaging: No results found.  PMFS History: Patient Active Problem List   Diagnosis Date Noted   Rupture of UCL of left thumb 11/23/2021   Closed displaced fracture of distal phalanx of right ring finger 10/30/2021   No past medical history on file.  No family history on file.  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   Procedure: TENDON TRANSPLANT;  Surgeon: Marlyne Beards, MD;  Location: [facility];  Service: Orthopedics;  Laterality: Left;   TONSILLECTOMY AND ADENOIDECTOMY     ULNAR COLLATERAL LIGAMENT REPAIR Left 11/30/2021   Procedure: LEFT THUMB ULNAR COLLATERAL LIGAMENT RECONSTRUCTION;  Surgeon: Marlyne Beards, MD;  Location: [facility];  Service: Orthopedics;  Laterality: Left;   Social History   Occupational History   Not on file  Tobacco Use   Smoking status: Never   Smokeless tobacco: Never  Vaping Use   Vaping Use: Never used   Substance and Sexual Activity   Alcohol use: Yes    Comment: occasional   Drug use: Never   Sexual activity: Not on file

## 2022-01-11 ENCOUNTER — Encounter: Payer: Self-pay | Admitting: Rehabilitative and Restorative Service Providers"

## 2022-01-11 ENCOUNTER — Ambulatory Visit (INDEPENDENT_AMBULATORY_CARE_PROVIDER_SITE_OTHER): Payer: BLUE CROSS/BLUE SHIELD | Admitting: Rehabilitative and Restorative Service Providers"

## 2022-01-11 DIAGNOSIS — R278 Other lack of coordination: Secondary | ICD-10-CM | POA: Diagnosis not present

## 2022-01-11 DIAGNOSIS — M6281 Muscle weakness (generalized): Secondary | ICD-10-CM | POA: Diagnosis not present

## 2022-01-11 DIAGNOSIS — R600 Localized edema: Secondary | ICD-10-CM | POA: Diagnosis not present

## 2022-01-11 DIAGNOSIS — M25642 Stiffness of left hand, not elsewhere classified: Secondary | ICD-10-CM | POA: Diagnosis not present

## 2022-01-11 DIAGNOSIS — M25542 Pain in joints of left hand: Secondary | ICD-10-CM

## 2022-01-11 NOTE — Therapy (Signed)
OUTPATIENT OCCUPATIONAL THERAPY TREATMENT NOTE   Patient Name: Shawn Parker MRN: [id number] DOB:1971/04/13, 51 y.o., male Today's Date: 01/11/2022  PCP: N/A REFERRING PROVIDER: Dr. Sherilyn Cooter   END OF SESSION:   OT End of Session - 01/11/22 0941     Visit Number 3    Number of Visits 14    Date for OT Re-Evaluation 02/08/22    OT Start Time 0855    OT Stop Time 0941    OT Time Calculation (min) 46 min    Equipment Utilized During Treatment orhtotic materials    Activity Tolerance Patient tolerated treatment well;No increased pain;Patient limited by fatigue;Patient limited by pain    Behavior During Therapy [facility] for tasks assessed/performed             History reviewed. No pertinent past medical history. 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   Procedure: TENDON TRANSPLANT;  Surgeon: Sherilyn Cooter, MD;  Location: [place];  Service: Orthopedics;  Laterality: Left;   TONSILLECTOMY AND ADENOIDECTOMY     ULNAR COLLATERAL LIGAMENT REPAIR Left 11/30/2021   Procedure: LEFT THUMB ULNAR COLLATERAL LIGAMENT RECONSTRUCTION;  Surgeon: Sherilyn Cooter, MD;  Location: [place];  Service: Orthopedics;  Laterality: Left;   Patient Active Problem List   Diagnosis Date Noted   Rupture of UCL of left thumb 11/23/2021   Closed displaced fracture of distal phalanx of right ring finger 10/30/2021    ONSET DATE: DOS 11/30/21   REFERRING DIAG: W09.811B (ICD-10-CM) - Rupture of ulnar collateral ligament of left thumb, initial encounter  THERAPY DIAG:  Other lack of coordination  Stiffness of left hand, not elsewhere classified  Localized edema  Muscle weakness (generalized)  Pain in joint of left hand  Rationale for Evaluation and Treatment Rehabilitation  PERTINENT HISTORY: Per MD: "Left thumb UCL reconstruction"   PRECAUTIONS: 6 weeks post sx now. Start light AROM at 4 weeks, AA/PROM at 5 weeks, light PRE 6+ weeks if stable, light  daily activities at 8-10 weeks and full use 10-12 weeks, consider longer for difficult rock climbing actives.    WEIGHT BEARING RESTRICTIONS Yes NWB in left hand now   SUBJECTIVE:  He states his son hit his hand with a metal rake and his small finger is swollen and painful at the PIP J.  This thumb is feeling fine and was not effected.   PAIN:  Are you having pain?  Yes Rating: 2-3/10 at rest now in left small finger  OBJECTIVE: (All objective assessments below are from initial evaluation on: 12/17/21 unless otherwise specified.)   HAND DOMINANCE: Left   ADLs: Overall ADLs: States decreased ability to grab, hold household objects, pain and inability to open containers, perform all FMS tasks, etc.      FUNCTIONAL OUTCOME MEASURES: Eval: Patient Specific Functional Scale: 1 (climbing, writing, washing/hygiene)   UPPER EXTREMITY ROM      Active ROM Right eval Left eval  Wrist flexion 74 59  Wrist extension 78 55  Wrist ulnar deviation      Wrist radial deviation      Wrist pronation      Wrist supination      (Blank rows = not tested)   Active ROM Left eval Left 01/11/22  Thumb MCP (0-60) NT (-22)- 52  Thumb IP (0-80) ~(-10) - 20* (+5)- 25  Thumb Radial abd/add (0-55)  NT 38*  Thumb Palmar abd/add (0-45)  NT 50*  Thumb Opposition to Small Finger  unable 1cm  to base of SF  (Blank rows = not tested)     UPPER EXTREMITY MMT:    12/17/21: NT at eval, but grossly weak and painful now, unsafe     HAND FUNCTION: Eval: decreased ability to pinch and grip, oppose, etc., now after injury and sx  Grip strength Left: TBD lbs    COORDINATION: Eval: decreased coordination in terms of GMS and FMS in left hand now Box and Blocks Test: TBD Blocks today (TBD is [facility]); 9 Hole Peg Test Right: TBDsec, Left: TBD sec    SENSATION: Eval: Light touch intact today, though diminished around sx area     EDEMA:             Eval:  Mildly swollen in left hand and thumb today    COGNITION: Overall cognitive status: WFL for evaluation today    OBSERVATIONS:         01/11/22: OT checks his SF which is not unstable in flex, ext or at PIP J laterally, but is swollen, TTP around PIP J.  He is able to flex and straighten completely with no significant added pain with motion at joint. Seems like no significant soft tissue ruptures, but OT can't rule out small fracture.  Recommends him to go get seen by physician for x-rays and OT will give orthotic for protection.      Eval: sx areas are healing well   TODAY'S TREATMENT:  01/11/22: OT makes custom orthotic to care for injured left SF- makes PIP J ext orthotic with MCP and DIP J free. Edu to wear 2-3 days, then attempt light AROM 3-4 x day 15x if stable and not painful. Also recommends seeing MD ASAP for imaging, etc.  He states understanding.  OT also checks motion and ability at thumb, assigns new thumb stretches, blocking AROM at IP J and MCPJ, and edu on light strength with t-putty to begin next week as tolerated (avoiding small finger). He states understanding and going on vacation next week and feels comfortable advancing thumb protocol over next few weeks himself. He was also edu to start weaning orthotic next week as tolerated for light activities and progress this over next 3-4 weeks until orthotic not needed by week 10 or so. HEP as below:   Exercises - Touch Thumb to [facility] Finger  - 3-4 x daily - 1-2 sets - 10 reps - Seated Thumb Composite Flexion AROM  - 4-6 x daily - 1 sets - 10-15 reps - Thumb AROM IP Blocking  - 4-6 x daily - 1 sets - 10-15 reps - Stretch Thumb Down (DON'T do like the picture)   - 4-6 x daily - 3 reps - 15 sec hold - "Duck Mouth" Strength  - 2-3 x daily - 5 reps - Finger Extension "Pizza!"   - 2-3 x daily - 5 reps - Finger Key Grip with Putty  - 2-3 x daily - 5 reps - Thumb Press  - 2-3 x daily - 5 reps - Thumb Opposition with Putty  - 2-3 x daily - 5 reps   12/24/21: Sx area looks well healing  today. OT does orthotic adjustment to allow full ROM at wrist and also widens thumb hole slightly to make easier to get on/off. OT reviews HEP with him does manual coban edema wrapping with edu for him to do safely under orthotic to manage swelling. OT reviews non-use and caution with UCL stress. Given instructions to start AROM gently and do over next 4-5  weeks, recommended to f/u in 2 weeks or sooner if issues.    PATIENT EDUCATION: Education details: See tx section above for details  Person educated: Patient Education method: Verbal Instruction, Teach back, Handouts  Education comprehension: States and demonstrates understanding, Additional Education required      HOME EXERCISE PROGRAM: Access Code: [email or web address] URL: [email or web address] Prepared by: Benito Mccreedy   GOALS: Goals reviewed with patient? Yes     SHORT TERM GOALS: (STG required if POC>30 days)   Pt will obtain protective, custom orthotic. Target date: 12/17/21 Goal status: MET   2.  Pt will demo/state understanding of initial HEP to improve pain levels and prerequisite motion. Target date: 01/04/22 Goal status: Met 01/11/22     LONG TERM GOALS:   Pt will improve functional ability by decreased impairment per assessment from 1 to 7 or better, for better quality of life. Target date: 02/15/22 Goal status: INITIAL   2.  Pt will improve grip strength in left dom hand to at least 50lbs for functional use at home and in IADLs. Target date: 02/15/22 Goal status: INITIAL   3.  Pt will improve A/ROM in Lt wrist flex and ext to at least 70* each, to have functional motion for tasks like reach and grasp.  Target date: 02/15/22 Goal status: INITIAL   4.  Pt will improve strength in whole left arm to at least 4+/5 MMT to have increased functional ability to carry out selfcare and higher-level homecare tasks with no difficulty. Target date: 02/15/22 Goal status: INITIAL     ASSESSMENT:   CLINICAL  IMPRESSION: 01/11/22: He may have fx in small finger (possibly distal P1 or proximal P2), and was recommended to f/u with MD.  He was also upgraded and is doing well at thumb and he can continue to progress there as instructed.   12/24/21: He is less tender with wrist motion and shows good ability to safely self-manage, so OT will only f/u with him periodically, as needed for upgrades or problems.    PLAN: OT FREQUENCY: 1-2x/week   OT DURATION: 8 weeks   PLANNED INTERVENTIONS: self care/ADL training, therapeutic exercise, therapeutic activity, neuromuscular re-education, manual therapy, scar mobilization, passive range of motion, splinting, ultrasound, fluidotherapy, compression bandaging, moist heat, cryotherapy, contrast bath, patient/family education, and coping strategies training   RECOMMENDED OTHER SERVICES: none now    CONSULTED AND AGREED WITH PLAN OF CARE: Patient   PLAN FOR NEXT SESSION:   Check motion, strength, etc after 3-4 weeks of self-management.   Benito Mccreedy, OTR/L, CHT 01/11/2022, 1:28 PM

## 2022-02-01 ENCOUNTER — Ambulatory Visit: Payer: BLUE CROSS/BLUE SHIELD | Admitting: Orthopedic Surgery

## 2022-02-04 ENCOUNTER — Ambulatory Visit: Payer: BLUE CROSS/BLUE SHIELD | Admitting: Orthopedic Surgery

## 2022-02-05 ENCOUNTER — Ambulatory Visit: Payer: BLUE CROSS/BLUE SHIELD | Admitting: Orthopedic Surgery

## 2022-02-12 ENCOUNTER — Ambulatory Visit (INDEPENDENT_AMBULATORY_CARE_PROVIDER_SITE_OTHER): Payer: BLUE CROSS/BLUE SHIELD | Admitting: Rehabilitative and Restorative Service Providers"

## 2022-02-12 ENCOUNTER — Ambulatory Visit (INDEPENDENT_AMBULATORY_CARE_PROVIDER_SITE_OTHER): Payer: BLUE CROSS/BLUE SHIELD | Admitting: Orthopedic Surgery

## 2022-02-12 ENCOUNTER — Ambulatory Visit (INDEPENDENT_AMBULATORY_CARE_PROVIDER_SITE_OTHER): Payer: BLUE CROSS/BLUE SHIELD

## 2022-02-12 ENCOUNTER — Encounter: Payer: Self-pay | Admitting: Rehabilitative and Restorative Service Providers"

## 2022-02-12 DIAGNOSIS — R278 Other lack of coordination: Secondary | ICD-10-CM | POA: Diagnosis not present

## 2022-02-12 DIAGNOSIS — M25642 Stiffness of left hand, not elsewhere classified: Secondary | ICD-10-CM | POA: Diagnosis not present

## 2022-02-12 DIAGNOSIS — M25542 Pain in joints of left hand: Secondary | ICD-10-CM | POA: Diagnosis not present

## 2022-02-12 DIAGNOSIS — M79645 Pain in left finger(s): Secondary | ICD-10-CM

## 2022-02-12 DIAGNOSIS — R600 Localized edema: Secondary | ICD-10-CM

## 2022-02-12 DIAGNOSIS — M6281 Muscle weakness (generalized): Secondary | ICD-10-CM

## 2022-02-12 DIAGNOSIS — S63639A Sprain of interphalangeal joint of unspecified finger, initial encounter: Secondary | ICD-10-CM

## 2022-02-12 DIAGNOSIS — S63642A Sprain of metacarpophalangeal joint of left thumb, initial encounter: Secondary | ICD-10-CM

## 2022-02-12 NOTE — Progress Notes (Signed)
   Post-Op Visit Note   Patient: Shawn Parker           Date of Birth: [birth year]           MRN: [id number] Visit Date: 02/12/2022 PCP: Pcp, No   Assessment & Plan:  Chief Complaint:  Chief Complaint  Patient presents with   Left Thumb - Follow-up   Left Little Finger - Pain   Visit Diagnoses:  1. Pain in left finger(s)   2. Sprain of proximal interphalangeal (PIP) joint of finger   3. Rupture of ulnar collateral ligament of left thumb, initial encounter     Plan: Patient is now approximately 11 weeks status post left thumb UCL reconstruction with FCR autograft and internal brace.  Doing well postoperatively.  He has been working with therapy to get through some stiffness at the thumb MCP joint.  He is able to oppose the thumb to the level of the small finger MCP joint.  He has no instability at the MCP joint with radial or ulnar deviation.  He is continue to work with therapy.  He describes new pain at the small finger PIP joint left hand.  He hit the finger against a fence around 4 weeks ago.  He has swelling and pain localized to the radial aspect of the small finger PIP joint consistent with a radial collateral ligament injury.  He has no pain with ulnar deviation at the small finger with slight flexion.  Discussed treating this with buddy taping the small to ring finger for stabilization.  X-rays of this finger were negative for acute bony injury.  I can see him back in a month or so of therapy.  Follow-Up Instructions: No follow-ups on file.   Orders:  Orders Placed This Encounter  Procedures   XR Finger Little Left   No orders of the defined types were placed in this encounter.   Imaging: No results found.  PMFS History: Patient Active Problem List   Diagnosis Date Noted   Sprain of proximal interphalangeal (PIP) joint of finger 02/12/2022   Rupture of UCL of left thumb 11/23/2021   Closed displaced fracture of distal phalanx of right ring finger 10/30/2021   No  past medical history on file.  No family history on file.  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   Procedure: TENDON TRANSPLANT;  Surgeon: Marlyne Beards, MD;  Location: [facility];  Service: Orthopedics;  Laterality: Left;   TONSILLECTOMY AND ADENOIDECTOMY     ULNAR COLLATERAL LIGAMENT REPAIR Left 11/30/2021   Procedure: LEFT THUMB ULNAR COLLATERAL LIGAMENT RECONSTRUCTION;  Surgeon: Marlyne Beards, MD;  Location: [facility] OR;  Service: Orthopedics;  Laterality: Left;   Social History   Occupational History   Not on file  Tobacco Use   Smoking status: Never   Smokeless tobacco: Never  Vaping Use   Vaping Use: Never used  Substance and Sexual Activity   Alcohol use: Yes    Comment: occasional   Drug use: Never   Sexual activity: Not on file

## 2022-02-12 NOTE — Therapy (Addendum)
OUTPATIENT OCCUPATIONAL THERAPY TREATMENT, PROGRESS & DISCHARGE NOTE   Patient Name: Shawn Parker MRN: [id number] DOB:12/07/1970, 51 y.o., male Today's Date: 02/12/2022  PCP: N/A REFERRING PROVIDER: Dr. Sherilyn Cooter   Progress Note  Reporting Period 12/17/21 to 02/12/22.   See note below for Objective Data and Assessment of Progress/Goals.      END OF SESSION:   OT End of Session - 02/12/22 1455     Visit Number 4    Number of Visits 14    Date for OT Re-Evaluation 03/15/22    OT Start Time 1456    OT Stop Time 1601    OT Time Calculation (min) 65 min    Equipment Utilized During Treatment orhtotic materials    Activity Tolerance Patient tolerated treatment well;Patient limited by fatigue;No increased pain    Behavior During Therapy [facility] for tasks assessed/performed             History reviewed. No pertinent past medical history. 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   Procedure: TENDON TRANSPLANT;  Surgeon: Sherilyn Cooter, MD;  Location: [place];  Service: Orthopedics;  Laterality: Left;   TONSILLECTOMY AND ADENOIDECTOMY     ULNAR COLLATERAL LIGAMENT REPAIR Left 11/30/2021   Procedure: LEFT THUMB ULNAR COLLATERAL LIGAMENT RECONSTRUCTION;  Surgeon: Sherilyn Cooter, MD;  Location: [place];  Service: Orthopedics;  Laterality: Left;   Patient Active Problem List   Diagnosis Date Noted   Sprain of proximal interphalangeal (PIP) joint of finger 02/12/2022   Rupture of UCL of left thumb 11/23/2021   Closed displaced fracture of distal phalanx of right ring finger 10/30/2021    ONSET DATE: DOS 11/30/21   REFERRING DIAG: W86.168H (ICD-10-CM) - Rupture of ulnar collateral ligament of left thumb, initial encounter  THERAPY DIAG:  Muscle weakness (generalized)  Other lack of coordination  Stiffness of left hand, not elsewhere classified  Pain in joint of left hand  Localized edema  Rationale for Evaluation and  Treatment Rehabilitation  PERTINENT HISTORY: Per MD: "Left thumb UCL reconstruction"   PRECAUTIONS: ~10.5 weeks post sx now. Start light AROM at 4 weeks, AA/PROM at 5 weeks, light PRE 6+ weeks if stable, light daily activities at 8-10 weeks and full use 10-12 weeks, consider longer for difficult rock climbing actives.    WEIGHT BEARING RESTRICTIONS Yes NWB in left hand now   SUBJECTIVE:  He states having some new pain in sore thumb IP J but no pain at MCP J and it's doing much better, he feels stronger, etc. Has been getting back to most ADLs and weaning from orthotic.    PAIN:  Are you having pain?  Yes  Rating: 1-2/10 at rest now in left thumb IP J, soreness  OBJECTIVE: (All objective assessments below are from initial evaluation on: 12/17/21 unless otherwise specified.)   HAND DOMINANCE: Left   ADLs: Overall ADLs: States decreased ability to grab, hold household objects, pain and inability to open containers, perform all FMS tasks, etc.      FUNCTIONAL OUTCOME MEASURES: 02/12/22: PSFS 5.3  Eval: Patient Specific Functional Scale: 1 (climbing, writing, washing/hygiene)   UPPER EXTREMITY ROM      Active ROM Right eval Left eval  Wrist flexion 74 59  Wrist extension 78 55  Wrist ulnar deviation      Wrist radial deviation      Wrist pronation      Wrist supination      (Blank rows = not tested)  Active ROM Left eval Left 01/11/22 Left 02/12/22  Thumb MCP (0-60) NT (-22)- 52 (-28) - 58  Thumb IP (0-80) ~(-10) - 20* (+5)- 25 0 -44  Thumb Radial abd/add (0-55)  NT 38*   Thumb Palmar abd/add (0-45)  NT 50*   Thumb Opposition to Small Finger  unable 1cm to base of SF   (Blank rows = not tested)     UPPER EXTREMITY MMT:    02/12/22: now at least 4-/5 MMT at left wrist and hand in all planes, little to no pain/tenderness    HAND FUNCTION: 02/12/22: Left grip: 85#   (right grip: 98#)  Eval: decreased ability to pinch and grip, oppose, etc., now after injury and sx   Grip strength Left: TBD lbs    COORDINATION: 02/12/22: can now almost fully oppose to base of SF, but stiff IP J is still limiting. 9HPT TBD, but FMS are improving.     SENSATION: 02/12/22: no c/o today    EDEMA:             Eval:  Mildly swollen at MCP J left thumb still, but otherwise better     OBSERVATIONS:       02/12/22: stable and non-tender in left thumb MCP J and IP J, though states soreness in IP J likely from constant stretching and he states also rubbing in orthotic from long-time use. OT tells him to d/c orthotic now during the dy unless lifting something very heavy or having pain.  SF injury is still slightly swollen and somewhat stiff, but stable and mild PIP J ext lag, but passively correctable.  MD did address and recommend buddy straps and caution for another month     01/11/22: OT checks his SF which is not unstable in flex, ext or at PIP J laterally, but is swollen, TTP around PIP J.  He is able to flex and straighten completely with no significant added pain with motion at joint. Seems like no significant soft tissue ruptures, but OT can't rule out small fracture.  Recommends him to go get seen by physician for x-rays and OT will give orthotic for protection.      Eval: sx areas are healing well   TODAY'S TREATMENT:  02/12/22: OT fabricates new SF ext night orthotic to put him on slight stretch in ext at PIP J ( he states loosing last orthotic for protection).  OT also supplies buddy strap and edu on safe use of SF. (Avoid pain).  OT also modifies hand-based thumb spica to include new dynamic flexion piece to passively stretch tight IP J into flexion- to be worn 2-3 x day for 15 min periods, as tolerated.  He states understanding.  He does new AROM and grip strength for measures which show improvement but that IP J is still very stiff.  Grip strength much improved but still decreased compared to non-dom hand, so upgraded green putty given out today and HEP reviewed.  OT also  edu to d/c orthotics for all "normal day use" now and just avoid anything painful- use caution for next  weeks, then resume all B/IADLs as tolerated.  He was cautioned to not return to high-level climbing or sports until 4 months post-op.  He states desire to return to therapy for possible orthotic adjustments and additional training to meet LTGs, so OT will ask for therapy extension. OT also edu on shift, rotation, translation FMS/in-hand manipulation activities to be performing at home to regain full FMS coordination, he  demo's back well, states understanding.      PATIENT EDUCATION: Education details: See tx section above for details  Person educated: Patient Education method: Verbal Instruction, Teach back, Handouts  Education comprehension: States and demonstrates understanding, Additional Education required      HOME EXERCISE PROGRAM: Access Code: [email or web address] URL: [email or web address] Prepared by: Benito Mccreedy   GOALS: Goals reviewed with patient? Yes     SHORT TERM GOALS: (STG required if POC>30 days)   Pt will obtain protective, custom orthotic. Target date: 12/17/21 Goal status: MET   2.  Pt will demo/state understanding of initial HEP to improve pain levels and prerequisite motion. Target date: 01/04/22 Goal status: Met 01/11/22     LONG TERM GOALS:   Pt will improve functional ability by decreased impairment per assessment from 1 to 7 or better, for better quality of life. Target date: 02/15/22 Goal status: 02/12/22: improved to 5.3 now, continue on    2.  Pt will improve grip strength in left dom hand to at least 50lbs for functional use at home and in IADLs. Target date: 02/15/22 Goal status: 02/12/22: MET and upgraded to 95# for high level sports    3.  Pt will improve A/ROM in Lt wrist flex and ext to at least 70* each, to have functional motion for tasks like reach and grasp.  Target date: 02/15/22 Goal status: 02/12/22: progressing    4.  Pt will  improve strength in whole left arm to at least 4+/5 MMT to have increased functional ability to carry out selfcare and higher-level homecare tasks with no difficulty. Target date: 02/15/22 Goal status: 02/12/22: all 4-/5 MMT or better now, continue on      ASSESSMENT:   CLINICAL IMPRESSION: 02/12/22: He has made significant gains in past 3 weeks in terms of motion and strength, but has some IP J stiffness, still weaker than right non-dom hand, still has not met all functional goals. Will request more time in therapy.   PLAN: OT FREQUENCY: 1x/week as needed   OT DURATION: 5 additional weeks (up to 03/15/22)    PLANNED INTERVENTIONS: self care/ADL training, therapeutic exercise, therapeutic activity, neuromuscular re-education, manual therapy, scar mobilization, passive range of motion, splinting, ultrasound, fluidotherapy, compression bandaging, moist heat, cryotherapy, contrast bath, patient/family education, and coping strategies training   RECOMMENDED OTHER SERVICES: none now    CONSULTED AND AGREED WITH PLAN OF CARE: Patient   PLAN FOR NEXT SESSION:   OT requesting additional time in therapy to supervise completion of all goals and get to full motion/strength.   Next session, adjust orthotics as needed, continue to provide upgrades to exercises and functional activities.    Benito Mccreedy, OTR/L, CHT 02/12/2022, 4:46 PM   OCCUPATIONAL THERAPY DISCHARGE SUMMARY  Visits from Start of Care: 4  Despite discussing needing more therapy at last visit (progress note), he did not return for a month or contact us to schedule, so OT will assume he is self-managing well at this point and will d/c POC today per policies. See note for details.   Benito Mccreedy, OTR/L, CHT 03/21/22

## 2022-02-14 ENCOUNTER — Ambulatory Visit: Payer: BLUE CROSS/BLUE SHIELD | Admitting: Family Medicine

## 2022-02-21 ENCOUNTER — Ambulatory Visit: Payer: BLUE CROSS/BLUE SHIELD | Admitting: Family Medicine

## 2022-03-07 ENCOUNTER — Ambulatory Visit: Payer: BLUE CROSS/BLUE SHIELD | Admitting: Family Medicine
